# Patient Record
Sex: Male | Born: 1965
Health system: Southern US, Community
[De-identification: ages and names within clinical notes are randomized; demographics above are authoritative.]

## PROBLEM LIST (undated history)

## (undated) DIAGNOSIS — J189 Pneumonia, unspecified organism: Secondary | ICD-10-CM

## (undated) DIAGNOSIS — G20A1 Parkinson's disease without dyskinesia, without mention of fluctuations: Secondary | ICD-10-CM

## (undated) DIAGNOSIS — R251 Tremor, unspecified: Secondary | ICD-10-CM

## (undated) DIAGNOSIS — E785 Hyperlipidemia, unspecified: Secondary | ICD-10-CM

## (undated) DIAGNOSIS — F419 Anxiety disorder, unspecified: Secondary | ICD-10-CM

## (undated) DIAGNOSIS — J40 Bronchitis, not specified as acute or chronic: Secondary | ICD-10-CM

## (undated) HISTORY — PX: HERNIA REPAIR: SHX51

## (undated) HISTORY — PX: APPENDECTOMY: SHX54

## (undated) HISTORY — DX: Tremor, unspecified: R25.1

## (undated) HISTORY — DX: Bronchitis, not specified as acute or chronic: J40

---

## 1998-11-25 ENCOUNTER — Emergency Department (HOSPITAL_COMMUNITY): Admission: EM | Admit: 1998-11-25 | Discharge: 1998-11-25 | Payer: Self-pay | Admitting: *Deleted

## 2002-02-26 ENCOUNTER — Emergency Department (HOSPITAL_COMMUNITY): Admission: EM | Admit: 2002-02-26 | Discharge: 2002-02-26 | Payer: Self-pay | Admitting: *Deleted

## 2002-02-26 ENCOUNTER — Encounter: Payer: Self-pay | Admitting: *Deleted

## 2002-03-03 ENCOUNTER — Inpatient Hospital Stay (HOSPITAL_COMMUNITY): Admission: EM | Admit: 2002-03-03 | Discharge: 2002-03-08 | Payer: Self-pay | Admitting: Psychiatry

## 2002-03-08 ENCOUNTER — Encounter (INDEPENDENT_AMBULATORY_CARE_PROVIDER_SITE_OTHER): Payer: Self-pay

## 2002-03-08 ENCOUNTER — Inpatient Hospital Stay (HOSPITAL_COMMUNITY): Admission: EM | Admit: 2002-03-08 | Discharge: 2002-03-10 | Payer: Self-pay | Admitting: Emergency Medicine

## 2002-03-08 ENCOUNTER — Encounter: Payer: Self-pay | Admitting: Emergency Medicine

## 2002-08-01 ENCOUNTER — Emergency Department (HOSPITAL_COMMUNITY): Admission: EM | Admit: 2002-08-01 | Discharge: 2002-08-01 | Payer: Self-pay | Admitting: Emergency Medicine

## 2005-07-14 ENCOUNTER — Ambulatory Visit (HOSPITAL_COMMUNITY): Admission: RE | Admit: 2005-07-14 | Discharge: 2005-07-14 | Payer: Self-pay | Admitting: Family Medicine

## 2007-01-20 ENCOUNTER — Observation Stay (HOSPITAL_COMMUNITY): Admission: RE | Admit: 2007-01-20 | Discharge: 2007-01-21 | Payer: Self-pay | Admitting: General Surgery

## 2007-02-14 ENCOUNTER — Emergency Department (HOSPITAL_COMMUNITY): Admission: EM | Admit: 2007-02-14 | Discharge: 2007-02-14 | Payer: Self-pay | Admitting: Emergency Medicine

## 2007-04-09 ENCOUNTER — Ambulatory Visit (HOSPITAL_COMMUNITY): Admission: RE | Admit: 2007-04-09 | Discharge: 2007-04-09 | Payer: Self-pay | Admitting: Family Medicine

## 2008-05-09 ENCOUNTER — Ambulatory Visit (HOSPITAL_COMMUNITY): Admission: RE | Admit: 2008-05-09 | Discharge: 2008-05-09 | Payer: Self-pay | Admitting: Orthopaedic Surgery

## 2008-06-24 ENCOUNTER — Emergency Department (HOSPITAL_COMMUNITY): Admission: EM | Admit: 2008-06-24 | Discharge: 2008-06-24 | Payer: Self-pay | Admitting: Emergency Medicine

## 2008-07-06 ENCOUNTER — Ambulatory Visit (HOSPITAL_COMMUNITY): Admission: RE | Admit: 2008-07-06 | Discharge: 2008-07-06 | Payer: Self-pay | Admitting: Orthopaedic Surgery

## 2008-11-12 ENCOUNTER — Emergency Department (HOSPITAL_COMMUNITY): Admission: EM | Admit: 2008-11-12 | Discharge: 2008-11-12 | Payer: Self-pay | Admitting: Emergency Medicine

## 2010-11-19 NOTE — Op Note (Signed)
NAMEHIRAN, [name]                    ACCOUNT NO.:  [id number]   MEDICAL RECORD NO.:  [id number]          PATIENT TYPE:  OBV   LOCATION:  A312                          FACILITY:  [facility]   PHYSICIAN:  Barbaraann Barthel, M.D. DATE OF BIRTH:  February 11, 1966   DATE OF PROCEDURE:  DATE OF DISCHARGE:                               OPERATIVE REPORT   NOTE:  This is a 45 year old male who had an umbilical hernia present  for several months.  He came to my office for elective repair.  This was  a small hernia and we decided to fix it, electively, and we discussed  complications not limited to; but including bleeding, infection and  recurrence.  Informed consent was obtained.   GROSS OPERATIVE FINDINGS:  Those consistent with a small umbilical  hernia defect.  This was closed with 1-0 Vicryl suture.   TECHNIQUE:  The patient was placed in the supine position after the  adequate administration of general anesthesia via LMA anesthesia.  He  was shaved, prepped with Betadine solution, and draped in usual manner.  A periumbilical incision was carried out over the superior aspect of the  umbilicus.  The umbilical hernia was dissected free this was small.  There was no specimen sent.  The defect was easily fixed with one suture  of GU #0 Vicryl suture.  This was done in a figure-of-eight fashion.  We  then used approximately 6 mL of 1/2% Sensorcaine for postoperative  comfort; and after irrigating we closed the skin with a stapling device  and applied a dressing with 4x4s and Neosporin ointment.   Prior to closure all sponge, needle, and instrument counts were found be  correct.  Estimated blood loss was minimal.  The patient received 500 mL  of crystalloids intraoperatively.  No drains were placed.  No specimens  were sent.  There were no complications.      Barbaraann Barthel, M.D.  Electronically Signed     WB/MEDQ  D:  01/20/2007  T:  01/20/2007  Job:  [id number]   cc:   Kirk Ruths, M.D.  Fax: [phone]

## 2010-11-22 NOTE — Discharge Summary (Signed)
NAMEALOK, MINSHALL NO.:  [id number]   MEDICAL RECORD NO.:  [id number]                  PATIENT TYPE:  IPS   LOCATION:                                       FACILITY:  [facility]   PHYSICIAN:  Milford Cage, M.D.                 DATE OF BIRTH:   DATE OF ADMISSION:  03/03/2002  DATE OF DISCHARGE:  03/08/2002                                 DISCHARGE SUMMARY   REASON FOR ADMISSION:  The patient was a 45 year old Caucasian male who was  admitted on a voluntary basis.  He presented to the emergency room with his  wife, who found him holding a gun to his head at home.  He stated that he  couldn't take it anymore.  He reports three years of multiple stressors  with wife out of work, credit card debt, illness of child and job stress.  His sleep has been disrupted and he feels hopeless to manage family  stresses.  He has not been able to concentrate at work.  He denies homicidal  ideation or auditory or visual hallucinations.  He denies manic symptoms or  panic attacks.  He has had no previous psychiatric treatment and is on no  current medications.   For further admission information, see the psychiatric admission assessment.   PHYSICAL EXAMINATION:  Done at the emergency department and reviewed by our  nurse practitioner.   LABORATORY DATA:  On admission, routine chemistry profile was within normal  limits except for a slightly decreased potassium of 3.4.  The hypothyroid  panel with TSH was within normal limits.  Urinalysis was negative.   HOSPITAL COURSE:  Upon admission, patient was placed on Ambien 10 mg p.o.  q.h.s. p.r.n. to repeat x 1 if needed for his insomnia.  He was also placed  on Seroquel 50 mg p.o. q.4h. p.r.n. anxiety.  On March 04, 2002, he was  begun on Zoloft 25 mg q.d.  There was a family session ordered with his wife  to address the stressors at home.  On March 06, 2002, Zoloft was increased  to 50 mg q.d. and Seroquel was increased  to 50 mg to be given with the  Zoloft dose since he felt somewhat activated after the antidepressant dose.  On March 07, 2002, trazodone 50 mg p.o. q.h.s. was added to his regimen.  On March 08, 2002, he began to complain of abdominal pain, nausea,  vomiting and diarrhea.  He was transferred to [facility] ED for evaluation.  At this time, he was admitted to [facility] for treatment of his  GI distress and did not return to our unit.   DISCHARGE MENTAL STATUS EXAM:  The patient had been improving but, at the  time of transfer to [facility], was in obvious discomfort due to his severe  abdominal pain.  An accurate  mental status examination was not able to be  done.   DISCHARGE DIAGNOSES:   AXIS I:  Major depression, single episode, severe.   AXIS II:  None.   AXIS III:  Acute abdominal pain.   AXIS IV:  Severe (marital stress as well as other stressors related in the  history of present illness).   AXIS V:  Global Assessment of Functioning upon admission 30; Global  Assessment of Functioning upon discharge 45; highest Global Assessment of  Functioning in past year 70.   DISCHARGE MEDICATIONS:  1. Zoloft 50 mg q.d.  2. Ambien 10 mg q.h.s.  3. Seroquel 50 mg with Zoloft dose and 50 mg p.o. q.4h. p.r.n. anxiety.   PLAN:  Other discharge planning was not able to be done due to his acute  abdomen and admission to [facility].  He was followed by the  consulting psychiatrist in that setting who would make the final disposition  orders.                                               Milford Cage, M.D.    BS/MEDQ  D:  05/25/2002  T:  05/26/2002  Job:  [id number]

## 2010-11-22 NOTE — Discharge Summary (Signed)
[name], [name]                              ACCOUNT NO.:  [id number]   MEDICAL RECORD NO.:  [id number]                   PATIENT TYPE:  INP   LOCATION:  0361                                 FACILITY:  [facility]   PHYSICIAN:  Anselm Pancoast. Zachery Dakins, M.D.          DATE OF BIRTH:  09-10-1965   DATE OF ADMISSION:  03/08/2002  DATE OF DISCHARGE:  03/10/2002                                 DISCHARGE SUMMARY   DISCHARGE DIAGNOSIS:  Acute appendicitis recently hospitalized at [facility] with depression and a suicide attempt or gesture.   OPERATION:  Laparoscopic appendectomy.   HISTORY:  The patient is a 45 year old Caucasian male who over the past two  weeks has had kind of a complex medical history.  Recently started having  pain in the lower abdomen and was seen at [facility].  Lab studies and a CT  were performed.  The appendix was normal.  He continued having pain and  episodes of nausea and vomiting, and then I think basically had a suicide  gesture and was hospitalized at [facility] health.  He was there for  about four or five days and started having nausea and vomiting approximately  12 hours prior to them transferring him over to the ER at [facility] where  he was seen by Dr. Beverely Pace.  At this time, his white count was 17,700.  He was  definitely tender in the lower abdomen and Dr. Beverely Pace obtained a repeat CT.  This time, it was consistent with acute appendicitis.  I was on call and was  asked to see the patient and on examination he was definitely tender in the  lower abdomen and was bloated, and I recommended we proceed on with a  appendectomy.  Hopefully, it could be performed laparoscopically.  The  attendant that had brought him over from [facility] left, and I told  him I would contact Dr. Kathrynn Running on whether he would need to be transferred  back to [facility] or what when he recovered from this appendectomy.   HOSPITAL COURSE:  At surgery, he did  have an acutely inflamed appendix that  was not ruptured, it was markedly inflamed but it was not such that had been  going on for 10 or 12 days and a laparoscopic appendectomy could be  performed, and we had him on Unasyn perioperatively.  The patient had done  nicely with the exception that he is a little bloated.  He has started  passing flatus today.  His incisions are healing nicely, and Dr. Kathrynn Running did  call and talk with the nurse and said that he needs to be on Zoloft 50 mg a  day which was given yesterday and today.  He is now on the second day, now  about 36 hours after surgery.  His abdomen is soft, incisions are healing  nicely, and the repeat white  count is 9800, and I think he can be  discharged.  The patient's wife said that she has talked with Dr. Kathrynn Running  and they have released him and said that we could just write a prescription  for the Zoloft.  I would like to be a little surer that this is the  instruction since supposedly this was a suicide gesture and what followup  from a psychiatry standpoint have been arranged I am not sure.  The patient  is ready to go home from a general surgical  standpoint, and a call has been placed for Dr. Kathrynn Running to confirm the wife's  instructions and arrange for the Zoloft to be picked up.  I will discharge  him on a few Vicodin if needed for pain, but I have encouraged him to be  active, ambulate, and Milk of Magnesia if he continues to have a little  problem with the constipation.                                               Anselm Pancoast. Zachery Dakins, M.D.    WJW/MEDQ  D:  03/10/2002  T:  03/10/2002  Job:  [id number]   cc:   Jeanice Lim, MD  [address]  [place], [place] [address]  Fax: 1

## 2010-11-22 NOTE — Op Note (Signed)
[name], DESANTIS                             ACCOUNT NO.:  [id number]   MEDICAL RECORD NO.:  [id number]                   PATIENT TYPE:  INP   LOCATION:  0107                                 FACILITY:  [facility]   PHYSICIAN:  Anselm Pancoast. Zachery Dakins, M.D.          DATE OF BIRTH:  1966/06/22   DATE OF PROCEDURE:  03/08/2002  DATE OF DISCHARGE:                                 OPERATIVE REPORT   PREOPERATIVE DIAGNOSIS:  Acute appendicitis.   POSTOPERATIVE DIAGNOSIS:  Acute appendicitis.   OPERATION:  Laparoscopic appendectomy.   ANESTHESIA:  General.   SURGEON:  Anselm Pancoast. Zachery Dakins, M.D.   ASSISTANT:  Nurse.   HISTORY:  The patient is a 45 year old male, who was sent over from  [facility] for nausea and vomiting of about 24 plus hours'  duration.  The patient is a 45 year old male, who approximately 10 days ago,  had an episode of abdominal pain, was seen in the emergency room at [facility].  A CT was performed which showed no evidence of acute appendicitis,  and he was released.  Several days later, he was seen again by a physician  for abdominal pain and then approximately five days ago, had a suicide  gesture and was admitted to the [facility] at that time.  He  was started on medication, and his wife said that they thought he was  possibly going to be released today but then over the weekend, started  having nausea and vomiting that persisted and was transferred over this  morning to the ER where Dr. Beverely Pace examined him, and he was definitely tender  in the lower abdomen, the right more so than left.  White count was 17,000,  and a CT was performed which now shows acute appendicitis.  I was called to  see the patient, and he was definitely acutely tender with kind of a bloated  abdomen and very tender and muscle guarding in the right lower quadrant.  The appendix looked like it was anterior to the cecum, and I recommended we  attempt to try to do  this with the laparoscope, and hopefully this is not  something that is chronically appendiceal abscess type problem.   DESCRIPTION OF PROCEDURE:  He was taken to surgery, given 3 g of Unasyn,  induction of general anesthesia endotracheal tube, oral tube into the  stomach and a Foley catheter inserted sterilely, and the area shaved below  the umbilicus, left lower quadrant, right lower quadrant, and where the  trocar for laparoscopic appendectomy was performed, and a small vertical  incision made below the umbilicus.  The fascia was identified and picked up  between to [facility], entered carefully.  Underlying peritoneum identified and  opened and then a traction suture of 0 Vicryl placed and Hasson cannula  introduced.  You could see the acute inflamed appendix in the right lower  quadrant, not  ruptured but obvious acutely inflamed.  The appendix looked  like someone's who has been having pain for 24-48 hours' duration, and the  upper 5 mm trocar was placed in the subxiphoid area, and then the lateral  left lower quadrant 10-11 trocar placed under direct vision.  With these, I  was able to grasp the appendix and retract it upward, and then the  appendiceal mesentery was separated out using a right angle and then the  vascular GIA used to transect the mesentery.  The cecum was not completely  visualized, but there was a little peritoneal reflection that we tried to  open up with the hook electrocautery but then showed the junction of the  inflamed portion with the cecum, and there was a little knuckle of appendix  that was normal, and I clamped this with a second load of the vascular GIA  and fired it.  The little area just to the lateral aspect did not completely  transect, and I reloaded the vascular GIA and then re-fired this little tip  in case it was not completely crossed with the staple line.  I then placed  the acutely inflamed appendix in the EndoCatch bag and then thoroughly   irrigated the right lower quadrant and aspirated.  I then brought out the  inflamed appendix within the bag at the umbilicus, reinserting the Hasson  cannula and then with the camera in the umbilicus, I thoroughly irrigated,  identified the suture line, and could see all the stapled areas which were  not bleeding.  The mesentery was not bleeding and then again irrigated and  aspirated.  The left lower quadrant trocar was removed first under direct  vision, no bleeding.  Then the little remaining irrigating fluid was  aspirated and the 5 mm trocar withdrawn, and then the carbon dioxide was  released and the umbilical trocar withdrawn.  I then placed a figure-of-  eight in addition to the pursestring, and these two both individually tied.  The subcutaneous wounds were closed with 4-0 Vicryl and Benzoin and Steri-  Strips on the skin.  I did not use Marcaine since this was an acutely  inflamed appendix.  The patient tolerated the procedure nicely and was  extubated and sent to the recovery room is a stable postop condition.  I  will talk with his psychiatrist on whether he needs to be on suicide  precautions and whether he will need to be transferred to the [facility] tomorrow or whether he can be released after his recuperation  from the acutely inflamed appendix.  I do not think from the findings that  were found at the time of surgery that this is something that has been going  on for 10 plus days, as the appendix certainly looked as if it was a typical  early appendicitis.                                                 Anselm Pancoast. Zachery Dakins, M.D.    WJW/MEDQ  D:  03/08/2002  T:  03/08/2002  Job:  [id number]   cc:   Jeanice Lim, MD  [address]  [place], [place] [address]  Fax: 1

## 2011-01-07 ENCOUNTER — Emergency Department (HOSPITAL_COMMUNITY)
Admission: EM | Admit: 2011-01-07 | Discharge: 2011-01-07 | Disposition: A | Discharge status: Home / Self Care | Payer: BC Managed Care – PPO | Attending: Emergency Medicine | Admitting: Emergency Medicine

## 2011-01-07 DIAGNOSIS — E860 Dehydration: Secondary | ICD-10-CM | POA: Insufficient documentation

## 2011-01-07 DIAGNOSIS — R112 Nausea with vomiting, unspecified: Secondary | ICD-10-CM | POA: Insufficient documentation

## 2011-01-07 DIAGNOSIS — Z79899 Other long term (current) drug therapy: Secondary | ICD-10-CM | POA: Insufficient documentation

## 2011-01-07 LAB — URINALYSIS, ROUTINE W REFLEX MICROSCOPIC
Bilirubin Urine: NEGATIVE
Ketones, ur: 15 mg/dL — AB
Specific Gravity, Urine: 1.015 (ref 1.005–1.030)
Urobilinogen, UA: 0.2 mg/dL (ref 0.0–1.0)

## 2011-01-07 LAB — BASIC METABOLIC PANEL
BUN: 18 mg/dL (ref 6–23)
Chloride: 101 mEq/L (ref 96–112)
Glucose, Bld: 118 mg/dL — ABNORMAL HIGH (ref 70–99)
Potassium: 3.8 mEq/L (ref 3.5–5.1)

## 2011-04-21 LAB — CBC
HCT: 40.3
MCV: 88
Platelets: 338
RBC: 4.57
WBC: 11.9 — ABNORMAL HIGH

## 2011-04-21 LAB — CULTURE, BLOOD (ROUTINE X 2)
Culture: NO GROWTH
Report Status: 8162008

## 2011-04-21 LAB — DIFFERENTIAL
Basophils Absolute: 0.1
Basophils Relative: 1
Monocytes Relative: 11
Neutrophils Relative %: 63

## 2011-04-21 LAB — WOUND CULTURE

## 2011-09-01 ENCOUNTER — Other Ambulatory Visit (HOSPITAL_COMMUNITY): Payer: Self-pay | Admitting: Family Medicine

## 2011-09-01 ENCOUNTER — Ambulatory Visit (HOSPITAL_COMMUNITY)
Admission: RE | Admit: 2011-09-01 | Discharge: 2011-09-01 | Disposition: A | Discharge status: Home / Self Care | Payer: BC Managed Care – PPO | Source: Ambulatory Visit | Attending: Family Medicine | Admitting: Family Medicine

## 2011-09-01 DIAGNOSIS — R05 Cough: Secondary | ICD-10-CM

## 2011-09-01 DIAGNOSIS — J209 Acute bronchitis, unspecified: Secondary | ICD-10-CM | POA: Insufficient documentation

## 2012-04-23 ENCOUNTER — Other Ambulatory Visit (HOSPITAL_COMMUNITY): Payer: Self-pay | Admitting: Orthopaedic Surgery

## 2012-04-23 DIAGNOSIS — R52 Pain, unspecified: Secondary | ICD-10-CM

## 2012-04-26 ENCOUNTER — Ambulatory Visit (HOSPITAL_COMMUNITY)
Admission: RE | Admit: 2012-04-26 | Discharge: 2012-04-26 | Disposition: A | Discharge status: Home / Self Care | Payer: BC Managed Care – PPO | Source: Ambulatory Visit | Attending: Orthopaedic Surgery | Admitting: Orthopaedic Surgery

## 2012-04-26 DIAGNOSIS — R52 Pain, unspecified: Secondary | ICD-10-CM

## 2012-04-26 DIAGNOSIS — M25519 Pain in unspecified shoulder: Secondary | ICD-10-CM | POA: Insufficient documentation

## 2012-04-26 DIAGNOSIS — R937 Abnormal findings on diagnostic imaging of other parts of musculoskeletal system: Secondary | ICD-10-CM | POA: Insufficient documentation

## 2012-05-14 ENCOUNTER — Other Ambulatory Visit (HOSPITAL_COMMUNITY): Payer: Self-pay | Admitting: Orthopaedic Surgery

## 2012-05-14 DIAGNOSIS — M542 Cervicalgia: Secondary | ICD-10-CM

## 2012-05-17 ENCOUNTER — Ambulatory Visit (HOSPITAL_COMMUNITY)
Admission: RE | Admit: 2012-05-17 | Discharge: 2012-05-17 | Disposition: A | Discharge status: Home / Self Care | Payer: BC Managed Care – PPO | Source: Ambulatory Visit | Attending: Orthopaedic Surgery | Admitting: Orthopaedic Surgery

## 2012-05-17 DIAGNOSIS — M542 Cervicalgia: Secondary | ICD-10-CM

## 2012-05-17 DIAGNOSIS — M5020 Other cervical disc displacement, unspecified cervical region: Secondary | ICD-10-CM | POA: Insufficient documentation

## 2012-05-18 ENCOUNTER — Encounter (HOSPITAL_COMMUNITY): Payer: Self-pay | Admitting: Pharmacy Technician

## 2012-05-18 ENCOUNTER — Other Ambulatory Visit: Payer: Self-pay | Admitting: Neurosurgery

## 2012-05-20 ENCOUNTER — Other Ambulatory Visit (HOSPITAL_COMMUNITY): Payer: BC Managed Care – PPO

## 2012-05-20 ENCOUNTER — Encounter (HOSPITAL_COMMUNITY): Payer: Self-pay

## 2012-05-20 ENCOUNTER — Encounter (HOSPITAL_COMMUNITY)
Admission: RE | Admit: 2012-05-20 | Discharge: 2012-05-20 | Disposition: A | Discharge status: Home / Self Care | Payer: BC Managed Care – PPO | Source: Ambulatory Visit | Attending: Neurosurgery | Admitting: Neurosurgery

## 2012-05-20 HISTORY — DX: Hyperlipidemia, unspecified: E78.5

## 2012-05-20 HISTORY — DX: Pneumonia, unspecified organism: J18.9

## 2012-05-20 HISTORY — DX: Anxiety disorder, unspecified: F41.9

## 2012-05-20 LAB — BASIC METABOLIC PANEL
CO2: 30 mEq/L (ref 19–32)
Calcium: 9.6 mg/dL (ref 8.4–10.5)
Creatinine, Ser: 0.88 mg/dL (ref 0.50–1.35)

## 2012-05-20 LAB — CBC
MCH: 30.1 pg (ref 26.0–34.0)
MCV: 86.3 fL (ref 78.0–100.0)
Platelets: 273 10*3/uL (ref 150–400)
RBC: 4.81 MIL/uL (ref 4.22–5.81)
RDW: 12.3 % (ref 11.5–15.5)

## 2012-05-20 LAB — SURGICAL PCR SCREEN
MRSA, PCR: NEGATIVE
Staphylococcus aureus: NEGATIVE

## 2012-05-20 MED ORDER — CEFAZOLIN SODIUM-DEXTROSE 2-3 GM-% IV SOLR
2.0000 g | INTRAVENOUS | Status: AC
  Administered 2012-05-21: 2 g via INTRAVENOUS
  Filled 2012-05-20: qty 50

## 2012-05-20 NOTE — Pre-Procedure Instructions (Signed)
20 DAVIYON LEAMING  05/20/2012   Your procedure is scheduled on:  Friday May 21, 2012  Report to [facility] at 5:30 AM.  Call this number if you have problems the morning of surgery: [phone]   Remember:   Do not eat food or drink :After Midnight.      Take these medicines the morning of surgery with A SIP OF WATER: xanax, hydrocodone   Do not wear jewelry, make-up or nail polish.  Do not wear lotions, powders, or perfumes.  Do not shave 48 hours prior to surgery. Men may shave face and neck.  Do not bring valuables to the hospital.  Contacts, dentures or bridgework may not be worn into surgery.  Leave suitcase in the car. After surgery it may be brought to your room.  For patients admitted to the hospital, checkout time is 11:00 AM the day of discharge.   Patients discharged the day of surgery will not be allowed to drive home.  Name and phone number of your driver: family / friend  Special Instructions: Shower using CHG 2 nights before surgery and the night before surgery.  If you shower the day of surgery use CHG.  Use special wash - you have one bottle of CHG for all showers.  You should use approximately 1/3 of the bottle for each shower.   Please read over the following fact sheets that you were given: Pain Booklet, Coughing and Deep Breathing, MRSA Information and Surgical Site Infection Prevention

## 2012-05-21 ENCOUNTER — Ambulatory Visit (HOSPITAL_COMMUNITY): Payer: BC Managed Care – PPO

## 2012-05-21 ENCOUNTER — Observation Stay (HOSPITAL_COMMUNITY)
Admission: RE | Admit: 2012-05-21 | Discharge: 2012-05-22 | Disposition: A | Discharge status: Home / Self Care | Payer: BC Managed Care – PPO | Source: Ambulatory Visit | Attending: Neurosurgery | Admitting: Neurosurgery

## 2012-05-21 ENCOUNTER — Encounter (HOSPITAL_COMMUNITY): Payer: Self-pay

## 2012-05-21 ENCOUNTER — Ambulatory Visit (HOSPITAL_COMMUNITY): Payer: BC Managed Care – PPO | Admitting: Anesthesiology

## 2012-05-21 ENCOUNTER — Encounter (HOSPITAL_COMMUNITY)
Admission: RE | Disposition: A | Discharge status: Home / Self Care | Payer: Self-pay | Source: Ambulatory Visit | Attending: Neurosurgery

## 2012-05-21 ENCOUNTER — Encounter (HOSPITAL_COMMUNITY): Payer: Self-pay | Admitting: Anesthesiology

## 2012-05-21 DIAGNOSIS — M5030 Other cervical disc degeneration, unspecified cervical region: Secondary | ICD-10-CM | POA: Insufficient documentation

## 2012-05-21 DIAGNOSIS — M47812 Spondylosis without myelopathy or radiculopathy, cervical region: Secondary | ICD-10-CM | POA: Insufficient documentation

## 2012-05-21 DIAGNOSIS — E785 Hyperlipidemia, unspecified: Secondary | ICD-10-CM | POA: Insufficient documentation

## 2012-05-21 DIAGNOSIS — Z79899 Other long term (current) drug therapy: Secondary | ICD-10-CM | POA: Insufficient documentation

## 2012-05-21 DIAGNOSIS — Z01812 Encounter for preprocedural laboratory examination: Secondary | ICD-10-CM | POA: Insufficient documentation

## 2012-05-21 DIAGNOSIS — M5020 Other cervical disc displacement, unspecified cervical region: Principal | ICD-10-CM | POA: Insufficient documentation

## 2012-05-21 HISTORY — PX: ANTERIOR CERVICAL DECOMP/DISCECTOMY FUSION: SHX1161

## 2012-05-21 SURGERY — ANTERIOR CERVICAL DECOMPRESSION/DISCECTOMY FUSION 2 LEVELS
Anesthesia: General | Site: Neck | Laterality: Bilateral | Wound class: Clean

## 2012-05-21 MED ORDER — KETOROLAC TROMETHAMINE 30 MG/ML IJ SOLN
INTRAMUSCULAR | Status: AC
  Administered 2012-05-21: 30 mg
  Filled 2012-05-21: qty 1

## 2012-05-21 MED ORDER — ACETAMINOPHEN 10 MG/ML IV SOLN
1000.0000 mg | Freq: Four times a day (QID) | INTRAVENOUS | Status: DC
  Administered 2012-05-21 – 2012-05-22 (×3): 1000 mg via INTRAVENOUS
  Filled 2012-05-21 (×4): qty 100

## 2012-05-21 MED ORDER — DIAZEPAM 5 MG/ML IJ SOLN
2.5000 mg | Freq: Once | INTRAMUSCULAR | Status: AC
  Administered 2012-05-21: 2.5 mg via INTRAVENOUS

## 2012-05-21 MED ORDER — NAPHAZOLINE-GLYCERIN 0.012-0.2 % OP SOLN
1.0000 [drp] | OPHTHALMIC | Status: DC | PRN
  Filled 2012-05-21: qty 15

## 2012-05-21 MED ORDER — HEMOSTATIC AGENTS (NO CHARGE) OPTIME
TOPICAL | Status: DC | PRN
  Administered 2012-05-21: 1 via TOPICAL

## 2012-05-21 MED ORDER — LIDOCAINE HCL (CARDIAC) 20 MG/ML IV SOLN
INTRAVENOUS | Status: DC | PRN
  Administered 2012-05-21: 75 mg via INTRAVENOUS

## 2012-05-21 MED ORDER — BUPIVACAINE HCL (PF) 0.5 % IJ SOLN
INTRAMUSCULAR | Status: DC | PRN
  Administered 2012-05-21: 5 mL

## 2012-05-21 MED ORDER — OXYCODONE HCL 5 MG PO TABS
5.0000 mg | ORAL_TABLET | Freq: Once | ORAL | Status: DC | PRN
  Administered 2012-05-21: 5 mg via ORAL

## 2012-05-21 MED ORDER — PHENOL 1.4 % MT LIQD: 1.0000 | OROMUCOSAL | Status: DC | PRN

## 2012-05-21 MED ORDER — SODIUM CHLORIDE 0.9 % IJ SOLN
3.0000 mL | Freq: Two times a day (BID) | INTRAMUSCULAR | Status: DC
  Administered 2012-05-21: 3 mL via INTRAVENOUS

## 2012-05-21 MED ORDER — MORPHINE SULFATE 4 MG/ML IJ SOLN
4.0000 mg | INTRAMUSCULAR | Status: DC | PRN
  Administered 2012-05-21 (×2): 4 mg via INTRAMUSCULAR
  Filled 2012-05-21 (×2): qty 1

## 2012-05-21 MED ORDER — 0.9 % SODIUM CHLORIDE (POUR BTL) OPTIME
TOPICAL | Status: DC | PRN
  Administered 2012-05-21: 1000 mL

## 2012-05-21 MED ORDER — THROMBIN 5000 UNITS EX KIT
PACK | CUTANEOUS | Status: DC | PRN
  Administered 2012-05-21 (×2): 5000 [IU] via TOPICAL

## 2012-05-21 MED ORDER — GLYCOPYRROLATE 0.2 MG/ML IJ SOLN
INTRAMUSCULAR | Status: DC | PRN
  Administered 2012-05-21: .8 mg via INTRAVENOUS

## 2012-05-21 MED ORDER — THROMBIN 5000 UNITS EX SOLR
OROMUCOSAL | Status: DC | PRN
  Administered 2012-05-21: 10:00:00 via TOPICAL

## 2012-05-21 MED ORDER — DIAZEPAM 5 MG/ML IJ SOLN
INTRAMUSCULAR | Status: AC
  Filled 2012-05-21: qty 2

## 2012-05-21 MED ORDER — KETOROLAC TROMETHAMINE 30 MG/ML IJ SOLN
30.0000 mg | Freq: Once | INTRAMUSCULAR | Status: DC
  Administered 2012-05-21: 30 mg via INTRAVENOUS

## 2012-05-21 MED ORDER — OXYCODONE HCL 5 MG PO TABS
ORAL_TABLET | ORAL | Status: AC
  Filled 2012-05-21: qty 1

## 2012-05-21 MED ORDER — SODIUM CHLORIDE 0.9 % IJ SOLN: 3.0000 mL | INTRAMUSCULAR | Status: DC | PRN

## 2012-05-21 MED ORDER — FENTANYL CITRATE 0.05 MG/ML IJ SOLN
INTRAMUSCULAR | Status: DC | PRN
  Administered 2012-05-21 (×3): 50 ug via INTRAVENOUS
  Administered 2012-05-21: 100 ug via INTRAVENOUS
  Administered 2012-05-21 (×2): 50 ug via INTRAVENOUS
  Administered 2012-05-21: 100 ug via INTRAVENOUS

## 2012-05-21 MED ORDER — MAGNESIUM HYDROXIDE 400 MG/5ML PO SUSP: 30.0000 mL | Freq: Every day | ORAL | Status: DC | PRN

## 2012-05-21 MED ORDER — SODIUM CHLORIDE 0.9 % IV SOLN
INTRAVENOUS | Status: AC
  Filled 2012-05-21: qty 500

## 2012-05-21 MED ORDER — PROPOFOL 10 MG/ML IV BOLUS
INTRAVENOUS | Status: DC | PRN
  Administered 2012-05-21: 200 mg via INTRAVENOUS

## 2012-05-21 MED ORDER — HYDROXYZINE HCL 50 MG/ML IM SOLN
50.0000 mg | INTRAMUSCULAR | Status: DC | PRN
  Filled 2012-05-21 (×2): qty 1

## 2012-05-21 MED ORDER — OXYCODONE HCL 5 MG PO TABS
5.0000 mg | ORAL_TABLET | ORAL | Status: DC | PRN
  Administered 2012-05-21 – 2012-05-22 (×3): 10 mg via ORAL
  Filled 2012-05-21 (×3): qty 2

## 2012-05-21 MED ORDER — NEOSTIGMINE METHYLSULFATE 1 MG/ML IJ SOLN
INTRAMUSCULAR | Status: DC | PRN
  Administered 2012-05-21: 4 mg via INTRAVENOUS

## 2012-05-21 MED ORDER — BISACODYL 10 MG RE SUPP: 10.0000 mg | Freq: Every day | RECTAL | Status: DC | PRN

## 2012-05-21 MED ORDER — ACETAMINOPHEN 650 MG RE SUPP: 650.0000 mg | RECTAL | Status: DC | PRN

## 2012-05-21 MED ORDER — INFLUENZA VIRUS VACC SPLIT PF IM SUSP
0.5000 mL | INTRAMUSCULAR | Status: DC
  Filled 2012-05-21: qty 0.5

## 2012-05-21 MED ORDER — ACETAMINOPHEN 325 MG PO TABS: 650.0000 mg | ORAL_TABLET | ORAL | Status: DC | PRN

## 2012-05-21 MED ORDER — POLYVINYL ALCOHOL 1.4 % OP SOLN
1.0000 [drp] | OPHTHALMIC | Status: DC | PRN
  Filled 2012-05-21: qty 15

## 2012-05-21 MED ORDER — OXYCODONE HCL 5 MG/5ML PO SOLN: 5.0000 mg | Freq: Once | ORAL | Status: DC | PRN

## 2012-05-21 MED ORDER — ERYTHROMYCIN 5 MG/GM OP OINT
TOPICAL_OINTMENT | Freq: Three times a day (TID) | OPHTHALMIC | Status: DC
  Administered 2012-05-21 – 2012-05-22 (×2): via OPHTHALMIC
  Filled 2012-05-21: qty 3.5

## 2012-05-21 MED ORDER — ROCURONIUM BROMIDE 100 MG/10ML IV SOLN
INTRAVENOUS | Status: DC | PRN
  Administered 2012-05-21 (×2): 20 mg via INTRAVENOUS
  Administered 2012-05-21: 50 mg via INTRAVENOUS

## 2012-05-21 MED ORDER — ONDANSETRON HCL 4 MG/2ML IJ SOLN: 4.0000 mg | Freq: Four times a day (QID) | INTRAMUSCULAR | Status: DC | PRN

## 2012-05-21 MED ORDER — SODIUM CHLORIDE 0.9 % IV SOLN: 250.0000 mL | INTRAVENOUS | Status: DC

## 2012-05-21 MED ORDER — MIDAZOLAM HCL 5 MG/5ML IJ SOLN
INTRAMUSCULAR | Status: DC | PRN
  Administered 2012-05-21: 2 mg via INTRAVENOUS

## 2012-05-21 MED ORDER — DEXAMETHASONE SODIUM PHOSPHATE 10 MG/ML IJ SOLN
INTRAMUSCULAR | Status: DC | PRN
  Administered 2012-05-21: 8 mg via INTRAVENOUS

## 2012-05-21 MED ORDER — ALPRAZOLAM 0.5 MG PO TABS: 0.5000 mg | ORAL_TABLET | Freq: Three times a day (TID) | ORAL | Status: DC | PRN

## 2012-05-21 MED ORDER — HYDROXYZINE HCL 25 MG PO TABS
50.0000 mg | ORAL_TABLET | ORAL | Status: DC | PRN
  Administered 2012-05-21: 50 mg via ORAL

## 2012-05-21 MED ORDER — KETOROLAC TROMETHAMINE 30 MG/ML IJ SOLN
30.0000 mg | Freq: Four times a day (QID) | INTRAMUSCULAR | Status: DC
  Administered 2012-05-21 – 2012-05-22 (×3): 30 mg via INTRAVENOUS
  Filled 2012-05-21 (×7): qty 1

## 2012-05-21 MED ORDER — BACITRACIN 50000 UNITS IM SOLR
INTRAMUSCULAR | Status: AC
  Filled 2012-05-21: qty 1

## 2012-05-21 MED ORDER — ACETAMINOPHEN 10 MG/ML IV SOLN
INTRAVENOUS | Status: AC
  Administered 2012-05-21: 1000 mg via INTRAVENOUS
  Filled 2012-05-21: qty 100

## 2012-05-21 MED ORDER — HYDROMORPHONE HCL PF 1 MG/ML IJ SOLN
0.2500 mg | INTRAMUSCULAR | Status: DC | PRN
  Administered 2012-05-21 (×2): 0.5 mg via INTRAVENOUS

## 2012-05-21 MED ORDER — ZOLPIDEM TARTRATE 5 MG PO TABS: 10.0000 mg | ORAL_TABLET | Freq: Every evening | ORAL | Status: DC | PRN

## 2012-05-21 MED ORDER — HYDROMORPHONE HCL PF 1 MG/ML IJ SOLN
INTRAMUSCULAR | Status: AC
  Filled 2012-05-21: qty 1

## 2012-05-21 MED ORDER — LIDOCAINE-EPINEPHRINE 1 %-1:100000 IJ SOLN
INTRAMUSCULAR | Status: DC | PRN
  Administered 2012-05-21: 5 mL

## 2012-05-21 MED ORDER — KCL IN DEXTROSE-NACL 20-5-0.45 MEQ/L-%-% IV SOLN
INTRAVENOUS | Status: DC
  Filled 2012-05-21 (×4): qty 1000

## 2012-05-21 MED ORDER — SODIUM CHLORIDE 0.9 % IR SOLN
Status: DC | PRN
  Administered 2012-05-21: 08:00:00

## 2012-05-21 MED ORDER — ALUM & MAG HYDROXIDE-SIMETH 200-200-20 MG/5ML PO SUSP: 30.0000 mL | Freq: Four times a day (QID) | ORAL | Status: DC | PRN

## 2012-05-21 MED ORDER — MENTHOL 3 MG MT LOZG: 1.0000 | LOZENGE | OROMUCOSAL | Status: DC | PRN

## 2012-05-21 MED ORDER — LACTATED RINGERS IV SOLN
INTRAVENOUS | Status: DC | PRN
  Administered 2012-05-21 (×2): via INTRAVENOUS

## 2012-05-21 MED ORDER — CYCLOBENZAPRINE HCL 10 MG PO TABS: 10.0000 mg | ORAL_TABLET | Freq: Three times a day (TID) | ORAL | Status: DC | PRN

## 2012-05-21 SURGICAL SUPPLY — 54 items
ADH SKN CLS APL DERMABOND .7 (GAUZE/BANDAGES/DRESSINGS) ×1
ALLOGRAFT 7X14X11 (Bone Implant) ×4 IMPLANT
BAG DECANTER FOR FLEXI CONT (MISCELLANEOUS) ×2 IMPLANT
BIT DRILL NEURO 2X3.1 SFT TUCH (MISCELLANEOUS) ×1 IMPLANT
BLADE ULTRA TIP 2M (BLADE) ×2 IMPLANT
BRUSH SCRUB EZ PLAIN DRY (MISCELLANEOUS) ×2 IMPLANT
CANISTER SUCTION 2500CC (MISCELLANEOUS) ×2 IMPLANT
CLOTH BEACON ORANGE TIMEOUT ST (SAFETY) ×2 IMPLANT
CONT SPEC 4OZ CLIKSEAL STRL BL (MISCELLANEOUS) ×2 IMPLANT
COVER MAYO STAND STRL (DRAPES) ×2 IMPLANT
DECANTER SPIKE VIAL GLASS SM (MISCELLANEOUS) ×2 IMPLANT
DERMABOND ADVANCED (GAUZE/BANDAGES/DRESSINGS) ×1
DERMABOND ADVANCED .7 DNX12 (GAUZE/BANDAGES/DRESSINGS) ×1 IMPLANT
DRAPE LAPAROTOMY 100X72 PEDS (DRAPES) ×2 IMPLANT
DRAPE MICROSCOPE LEICA (MISCELLANEOUS) ×2 IMPLANT
DRAPE POUCH INSTRU U-SHP 10X18 (DRAPES) ×2 IMPLANT
DRAPE PROXIMA HALF (DRAPES) IMPLANT
DRILL NEURO 2X3.1 SOFT TOUCH (MISCELLANEOUS) ×2
ELECT COATED BLADE 2.86 ST (ELECTRODE) ×2 IMPLANT
ELECT REM PT RETURN 9FT ADLT (ELECTROSURGICAL) ×2
ELECTRODE REM PT RTRN 9FT ADLT (ELECTROSURGICAL) ×1 IMPLANT
GLOVE BIOGEL PI IND STRL 8 (GLOVE) ×1 IMPLANT
GLOVE BIOGEL PI IND STRL 8.5 (GLOVE) ×2 IMPLANT
GLOVE BIOGEL PI INDICATOR 8 (GLOVE) ×1
GLOVE BIOGEL PI INDICATOR 8.5 (GLOVE) ×2
GLOVE ECLIPSE 6.5 STRL STRAW (GLOVE) ×2 IMPLANT
GLOVE ECLIPSE 7.5 STRL STRAW (GLOVE) ×2 IMPLANT
GLOVE EXAM NITRILE LRG STRL (GLOVE) IMPLANT
GLOVE EXAM NITRILE MD LF STRL (GLOVE) ×2 IMPLANT
GLOVE EXAM NITRILE XL STR (GLOVE) IMPLANT
GLOVE EXAM NITRILE XS STR PU (GLOVE) IMPLANT
GLOVE SURG SS PI 8.0 STRL IVOR (GLOVE) ×8 IMPLANT
GOWN BRE IMP SLV AUR LG STRL (GOWN DISPOSABLE) ×2 IMPLANT
GOWN BRE IMP SLV AUR XL STRL (GOWN DISPOSABLE) ×2 IMPLANT
GOWN STRL REIN 2XL LVL4 (GOWN DISPOSABLE) ×4 IMPLANT
HEAD HALTER (SOFTGOODS) ×2 IMPLANT
HEMOSTAT POWDER SURGIFOAM 1G (HEMOSTASIS) ×2 IMPLANT
KIT BASIN OR (CUSTOM PROCEDURE TRAY) ×2 IMPLANT
KIT ROOM TURNOVER OR (KITS) ×2 IMPLANT
NEEDLE HYPO 25X1 1.5 SAFETY (NEEDLE) ×2 IMPLANT
NEEDLE SPNL 22GX3.5 QUINCKE BK (NEEDLE) ×4 IMPLANT
NS IRRIG 1000ML POUR BTL (IV SOLUTION) ×2 IMPLANT
PACK LAMINECTOMY NEURO (CUSTOM PROCEDURE TRAY) ×2 IMPLANT
PAD ARMBOARD 7.5X6 YLW CONV (MISCELLANEOUS) ×6 IMPLANT
RUBBERBAND STERILE (MISCELLANEOUS) ×4 IMPLANT
SPONGE INTESTINAL PEANUT (DISPOSABLE) ×2 IMPLANT
SPONGE SURGIFOAM ABS GEL SZ50 (HEMOSTASIS) ×2 IMPLANT
STAPLER SKIN PROX WIDE 3.9 (STAPLE) IMPLANT
SUT VIC AB 2-0 CP2 18 (SUTURE) ×2 IMPLANT
SUT VIC AB 3-0 SH 8-18 (SUTURE) ×2 IMPLANT
SYR 20ML ECCENTRIC (SYRINGE) ×2 IMPLANT
TOWEL OR 17X24 6PK STRL BLUE (TOWEL DISPOSABLE) ×2 IMPLANT
TOWEL OR 17X26 10 PK STRL BLUE (TOWEL DISPOSABLE) ×2 IMPLANT
WATER STERILE IRR 1000ML POUR (IV SOLUTION) ×2 IMPLANT

## 2012-05-21 NOTE — Anesthesia Postprocedure Evaluation (Signed)
Anesthesia Post Note  Patient: [name]  Procedure(s) Performed: Procedure(s) (LRB): ANTERIOR CERVICAL DECOMPRESSION/DISCECTOMY FUSION 2 LEVELS (Bilateral)  Anesthesia type: General  Patient location: PACU  Post pain: Pain level controlled and Adequate analgesia  Post assessment: Post-op Vital signs reviewed, Patient's Cardiovascular Status Stable, Respiratory Function Stable, Patent Airway and Pain level controlled  Last Vitals:  Filed Vitals:   05/21/12 1035  BP:   Pulse:   Temp: 36.3 C  Resp:     Post vital signs: Reviewed and stable  Level of consciousness: awake, alert  and oriented  Complications: No apparent anesthesia complications

## 2012-05-21 NOTE — Progress Notes (Signed)
Filed Vitals:   05/21/12 1124 05/21/12 1139 05/21/12 1215 05/21/12 1634  BP: 126/77 135/81 138/87 104/75  Pulse: 93 86 98 111  Temp:   97.8 F (36.6 C) 97.5 F (36.4 C)  TempSrc:      Resp: 15 13 16 16   Height:      Weight:      SpO2: 100% 99% 93% 94%    CBC  Basename 05/20/12 1545  WBC 8.4  HGB 14.5  HCT 41.5  PLT 273   BMET  Basename 05/20/12 1545  NA 141  K 4.1  CL 102  CO2 30  GLUCOSE 101*  BUN 8  CREATININE 0.88  CALCIUM 9.6    Patient complaining of right thigh discomfort, suspect minor corneal abrasion, we'll have erythromycin ophthalmologic ointment applied to 8 hours and have the lid taped shut. Has been up and living in the halls. Voiding. Wound clean and dry. Feels upper extremities feel better. Doing well following surgery.  Plan: Encouraged and it actively in the halls.  Hewitt Shorts, MD 05/21/2012, 5:33 PM

## 2012-05-21 NOTE — Anesthesia Preprocedure Evaluation (Signed)
Anesthesia Evaluation  Patient identified by MRN, date of birth, ID band Patient awake    Reviewed: Allergy & Precautions, H&P , NPO status , Patient's Chart, lab work & pertinent test results  Airway Mallampati: II  Neck ROM: full    Dental   Pulmonary          Cardiovascular     Neuro/Psych Anxiety    GI/Hepatic   Endo/Other    Renal/GU      Musculoskeletal   Abdominal   Peds  Hematology   Anesthesia Other Findings   Reproductive/Obstetrics                           Anesthesia Physical Anesthesia Plan  ASA: II  Anesthesia Plan: General   Post-op Pain Management:    Induction: Intravenous  Airway Management Planned: Oral ETT  Additional Equipment:   Intra-op Plan:   Post-operative Plan: Extubation in OR  Informed Consent: I have reviewed the patients History and Physical, chart, labs and discussed the procedure including the risks, benefits and alternatives for the proposed anesthesia with the patient or authorized representative who has indicated his/her understanding and acceptance.     Plan Discussed with: CRNA and Surgeon  Anesthesia Plan Comments:         Anesthesia Quick Evaluation

## 2012-05-21 NOTE — Preoperative (Signed)
Beta Blockers   Reason not to administer Beta Blockers:Not Applicable 

## 2012-05-21 NOTE — Anesthesia Procedure Notes (Signed)
Procedure Name: Intubation Date/Time: 05/21/2012 7:58 AM Performed by: Gwenyth Allegra Pre-anesthesia Checklist: Patient identified, Timeout performed, Emergency Drugs available, Suction available and Patient being monitored Patient Re-evaluated:Patient Re-evaluated prior to inductionOxygen Delivery Method: Circle system utilized Preoxygenation: Pre-oxygenation with 100% oxygen Intubation Type: IV induction Ventilation: Mask ventilation without difficulty and Oral airway inserted - appropriate to patient size Laryngoscope Size: Mac and 4 Grade View: Grade I Tube type: Oral Number of attempts: 1 Airway Equipment and Method: Stylet Placement Confirmation: ETT inserted through vocal cords under direct vision,  breath sounds checked- equal and bilateral and positive ETCO2 Secured at: 22 cm Tube secured with: Tape Dental Injury: Teeth and Oropharynx as per pre-operative assessment

## 2012-05-21 NOTE — Transfer of Care (Signed)
Immediate Anesthesia Transfer of Care Note  Patient: [name]  Procedure(s) Performed: Procedure(s) (LRB) with comments: ANTERIOR CERVICAL DECOMPRESSION/DISCECTOMY FUSION 2 LEVELS (Bilateral) - Cervical five-six, six-seven Anterior cervical decompression/diskectomy/fusion  Patient Location: PACU  Anesthesia Type:General  Level of Consciousness: awake and alert   Airway & Oxygen Therapy: Patient Spontanous Breathing and Patient connected to nasal cannula oxygen  Post-op Assessment: Report given to PACU RN and Post -op Vital signs reviewed and stable  Post vital signs: Reviewed and stable  Complications: No apparent anesthesia complications

## 2012-05-21 NOTE — H&P (Signed)
Subjective: the Patient is a 46 y.o. male who is admitted for treatment of multilevel cervical disc herniations, with underlying degenerative disease and spondylosis. Symptomatically he is been having difficulties with increasing left cervical radiculopathy with pain numbness and paresthesias from the neck down to the left upper extremity. His spine disc herniation C5-6 worsened C6-7, C5-6 broad-based, C6-7 is more focal to the left side. He is admitted now 2 level C5-6 and C6-7 anterior cervical decompression arthrodesis with allograft and cervical plating.   Past Medical History  Diagnosis Date  . Hyperlipidemia     controlled with diet  . Pneumonia     hx of  . Anxiety     "mostly to unwind and go to sleep, works third shift"    Past Surgical History  Procedure Date  . Appendectomy   . Hernia repair     umbilical    Prescriptions prior to admission  Medication Sig Dispense Refill  . ALPRAZolam (XANAX) 0.5 MG tablet Take 1 tablet by mouth Three times daily as needed. For anxiety      . HYDROcodone-acetaminophen (NORCO) 7.5-325 MG per tablet Take 1 tablet by mouth Every 8 hours as needed. For pain       No Known Allergies  History  Substance Use Topics  . Smoking status: Never Smoker   . Smokeless tobacco: Not on file  . Alcohol Use: Yes     Comment: occasional    History reviewed. No pertinent family history.   Review of Systems A comprehensive review of systems was negative.  Objective: Vital signs in last 24 hours: Temp:  [98.1 F (36.7 C)] 98.1 F (36.7 C) (11/15 0615) Pulse Rate:  [71] 71  (11/15 0615) Resp:  [20] 20  (11/15 0615) BP: (129)/(84) 129/84 mmHg (11/15 0615) SpO2:  [97 %] 97 % (11/15 0615) Weight:  [87.998 kg (194 lb)] 87.998 kg (194 lb) (11/15 4098)  EXAM: Patient is a well-developed well-nourished white male in no acute distress. Lungs are clear to auscultation , the patient has symmetrical respiratory excursion. Heart has a regular rate and rhythm  normal S1 and S2 no murmur.   Abdomen is soft nontender nondistended bowel sounds are present. Extremity examination shows no clubbing cyanosis or edema. Musculoskeletal examination shows no tenderness to palpation of the cervical spinous processes or paracervical musculature. He is slightly decreased range of motion flexion extension and lateral flexion to either side. Motor examination shows 5 over 5 strength in the upper extremities including the deltoid biceps triceps and intrinsics and grip. Sensation is intact to pinprick throughout the digits of the upper extremities. Reflexes are symmetrical and without evidence of pathologic reflexes. Patient has a normal gait and stance.   Data Review:CBC    Component Value Date/Time   WBC 8.4 05/20/2012 1545   RBC 4.81 05/20/2012 1545   HGB 14.5 05/20/2012 1545   HCT 41.5 05/20/2012 1545   PLT 273 05/20/2012 1545   MCV 86.3 05/20/2012 1545   MCH 30.1 05/20/2012 1545   MCHC 34.9 05/20/2012 1545   RDW 12.3 05/20/2012 1545   LYMPHSABS 2.6 02/14/2007 0110   MONOABS 1.4* 02/14/2007 0110   EOSABS 0.3 02/14/2007 0110   BASOSABS 0.1 02/14/2007 0110                          BMET    Component Value Date/Time   NA 141 05/20/2012 1545   K 4.1 05/20/2012 1545   CL 102 05/20/2012  1545   CO2 30 05/20/2012 1545   GLUCOSE 101* 05/20/2012 1545   BUN 8 05/20/2012 1545   CREATININE 0.88 05/20/2012 1545   CALCIUM 9.6 05/20/2012 1545   GFRNONAA >90 05/20/2012 1545   GFRAA >90 05/20/2012 1545     Assessment/Plan: Patient with neck and left cervical radicular pain secondary to spondylitic disc herniations at C5-6 and C6-7 who is admitted for a 2 level C5-6 and C6-7 ACDF.  I've discussed with the patient the nature of his condition, the nature the surgical procedure, the typical length of surgery, hospital stay, and overall recuperation. We discussed limitations postoperatively. I discussed risks of surgery including risks of infection, bleeding, possibly need  for transfusion, the risk of nerve root dysfunction with pain, weakness, numbness, or paresthesias, the risk of spinal cord dysfunction with paralysis of all 4 limbs and quadriplegia, and the risk of dural tear and CSF leakage and possible need for further surgery, the risk of esophageal dysfunction causing dysphagia and the risk of laryngeal dysfunction causing hoarseness of the voice, the risk of failure of the arthrodesis and the possible need for further surgery, and the risk of anesthetic complications including myocardial infarction, stroke, pneumonia, and death. We also discussed the need for postoperative immobilization in a cervical collar. Understanding all this the patient does wish to proceed with surgery and is admitted for such.    Hewitt Shorts, MD 05/21/2012 7:32 AM

## 2012-05-21 NOTE — Op Note (Signed)
05/21/2012  10:20 AM  PATIENT:  [name]  46 y.o. male  PRE-OPERATIVE DIAGNOSIS:  Cervical five-six, six-seven herniated nucleus pulposus without myelopathy, degenerative disc disease,spondylosis without myelopathy, radiculopathy  POST-OPERATIVE DIAGNOSIS:  Cervical five-six, six-seven herniated nucleus pulposus without myelopathy, degenerative disc disease,spondylosis without myelopathy, radiculopathy  PROCEDURE:  Procedure(s): ANTERIOR CERVICAL DECOMPRESSION/DISCECTOMY FUSION 2 LEVELS:  C5-6 and C6-7 anterior cervical decompression and arthrodesis with allograft and tether cervical plating  SURGEON:  Surgeon(s): Hewitt Shorts, MD Carmela Hurt, MD  ASSISTANTS: Coletta Memos, M.D.  ANESTHESIA:   general  EBL:  Total I/O In: 1100 [I.V.:1100] Out: 125 [Blood:125]  BLOOD ADMINISTERED:none  COUNT: Correct per nursing staff  DICTATION: Patient was brought to the operating room placed under general endotracheal anesthesia. Patient was placed in 10 pounds of halter traction. The neck was prepped with Betadine soap and solution and draped in a sterile fashion. A horizontal incision was made on the left side of the neck. The line of the incision was infiltrated with local anesthetic with epinephrine. Dissection was carried down thru the subcutaneous tissue and platysma, bipolar cautery was used to maintain hemostasis. Dissection was then carried out thru an avascular plane leaving the sternocleidomastoid carotid artery and jugular vein laterally and the trachea and esophagus medially. The ventral aspect of the vertebral column was identified and a localizing x-ray was taken. The C5-6 and C6-7 levels were identified. The annulus at each level was incised and the disc space entered. Discectomy was performed with micro-curettes and pituitary rongeurs. The operating microscope was draped and brought into the field provided additional magnification illumination and visualization. Discectomy was  continued posteriorly thru the disc space and then the cartilaginous endplate was removed using micro-curettes along with the high-speed drill. Posterior osteophytic overgrowth was removed each level using the high-speed drill along with a 2 mm thin footplated Kerrison punch. Posterior longitudinal ligament along with disc herniation was carefully removed, decompressing the spinal canal and thecal sac. We then continued to remove osteophytic overgrowth and disc material decompressing the neural foramina and exiting nerve roots bilaterally. Once the decompression was completed hemostasis was established at each level with the use of Gelfoam with thrombin and bipolar cautery. The Gelfoam was removed the wound irrigated and hemostasis confirmed. We then measured the height of the intravertebral disc space level and selected a 7 millimeter in height structural allograft for the C5-6 level and a 7 millimeter in height structural allograft for the C6-7 level . Each was hydrated and saline solution and then gently positioned in the intravertebral disc space and countersunk. We then selected a 35 millimeter in height Tether cervical plate. It was positioned over the fusion construct and secured to the vertebra with a pair of 4 x 15 mm variable screws at the C5 level, a single 4 x 15 fixed screw at the C6 level, and a pair of 4 x 15 mm variable screws at the C7 level. Each screw hole was started with the high-speed drill and then the screws placed, once all the screws were placed final tightening was performed. The wound was irrigated with bacitracin solution checked for hemostasis which was established and confirmed. An x-ray was taken which showed grafts in good position, the plate and screws in good position, and the overall alignment to be good. We then proceeded with closure. The platysma was closed with interrupted inverted 2-0 undyed Vicryl suture, the subcutaneous and subcuticular closed with interrupted inverted 3-0  undyed Vicryl suture. The skin edges were  approximated with Dermabond. Following surgery the patient was taken out of cervical traction. To be reversed and the anesthetic and taken to the recovery room for further care.   PLAN OF CARE: Admit for overnight observation  PATIENT DISPOSITION:  PACU - hemodynamically stable.   Delay start of Pharmacological VTE agent (>24hrs) due to surgical blood loss or risk of bleeding:  yes

## 2012-05-21 NOTE — Progress Notes (Signed)
Dr. Chaney Malling notified that pt's.right eye still feels like burning and feels something in it when he closed his right eye. Will comeby to check on pt.

## 2012-05-22 ENCOUNTER — Encounter (HOSPITAL_COMMUNITY): Payer: Self-pay | Admitting: *Deleted

## 2012-05-22 MED ORDER — OXYCODONE-ACETAMINOPHEN 5-325 MG PO TABS: 1.0000 | ORAL_TABLET | ORAL | Status: DC | PRN

## 2012-05-22 NOTE — Discharge Summary (Signed)
Physician Discharge Summary  Patient ID: [name] MRN: [id number] DOB/AGE: [id number] y.o.  Admit date: 05/21/2012 Discharge date: 05/22/2012  Admission Diagnoses:  Cervical disc herniation, cervical spondylosis, cervical degenerative disc disease, cervical radiculopathy   Discharge Diagnoses:  Cervical disc herniation, cervical spondylosis, cervical degenerative disc disease, cervical radiculopathy  Discharged Condition: good  Hospital Course: Patient was admitted underwent a 2 level CV-6 and C6-7 ACDF. He has done well following surgery. He is up and living actively in the halls. His wound is clean and dry, and healing well. He is being discharged home with instructions regarding wound care and activities. He is to followup with me in the office in 3 weeks.  Discharge Exam: Blood pressure 111/61, pulse 70, temperature 98 F (36.7 C), temperature source Oral, resp. rate 18, height 5\' 8"  (1.727 m), weight 87.998 kg (194 lb), SpO2 99.00%.  Disposition: Home     Medication List     As of 05/22/2012  9:14 AM    TAKE these medications         ALPRAZolam 0.5 MG tablet   Commonly known as: XANAX   Take 1 tablet by mouth Three times daily as needed. For anxiety      HYDROcodone-acetaminophen 7.5-325 MG per tablet   Commonly known as: NORCO   Take 1 tablet by mouth Every 8 hours as needed. For pain      oxyCODONE-acetaminophen 5-325 MG per tablet   Commonly known as: PERCOCET/ROXICET   Take 1-2 tablets by mouth every 4 (four) hours as needed for pain.         Signed: Hewitt Shorts, MD 05/22/2012, 9:14 AM

## 2012-05-24 ENCOUNTER — Encounter (HOSPITAL_COMMUNITY): Payer: Self-pay | Admitting: Neurosurgery

## 2012-06-28 ENCOUNTER — Encounter (HOSPITAL_COMMUNITY): Payer: Self-pay | Admitting: Emergency Medicine

## 2012-06-28 ENCOUNTER — Emergency Department (HOSPITAL_COMMUNITY)
Admission: EM | Admit: 2012-06-28 | Discharge: 2012-06-28 | Disposition: A | Discharge status: Home Health | Payer: BC Managed Care – PPO | Attending: Emergency Medicine | Admitting: Emergency Medicine

## 2012-06-28 DIAGNOSIS — E785 Hyperlipidemia, unspecified: Secondary | ICD-10-CM | POA: Insufficient documentation

## 2012-06-28 DIAGNOSIS — Z791 Long term (current) use of non-steroidal anti-inflammatories (NSAID): Secondary | ICD-10-CM | POA: Insufficient documentation

## 2012-06-28 DIAGNOSIS — Z8701 Personal history of pneumonia (recurrent): Secondary | ICD-10-CM | POA: Insufficient documentation

## 2012-06-28 DIAGNOSIS — Z981 Arthrodesis status: Secondary | ICD-10-CM | POA: Insufficient documentation

## 2012-06-28 DIAGNOSIS — Z8659 Personal history of other mental and behavioral disorders: Secondary | ICD-10-CM | POA: Insufficient documentation

## 2012-06-28 DIAGNOSIS — M542 Cervicalgia: Secondary | ICD-10-CM | POA: Insufficient documentation

## 2012-06-28 MED ORDER — OXYCODONE-ACETAMINOPHEN 5-325 MG PO TABS: 2.0000 | ORAL_TABLET | ORAL | Status: DC | PRN

## 2012-06-28 MED ORDER — OXYCODONE-ACETAMINOPHEN 5-325 MG PO TABS
2.0000 | ORAL_TABLET | Freq: Once | ORAL | Status: AC
  Administered 2012-06-28: 2 via ORAL
  Filled 2012-06-28: qty 2

## 2012-06-28 NOTE — ED Notes (Signed)
Had surgery 5 weeks on his neck . Was relesed was sleeping in recliner last Tuesday had pain in neck left side sharp. Called office on Friday to tell then about popping and sharp pain . Tried to sleep in bed last night but it hurt to bad

## 2012-06-28 NOTE — ED Provider Notes (Signed)
History     CSN: [id number]  Arrival date & time 06/28/12  1308   First MD Initiated Contact with Patient 06/28/12 [phone]      No chief complaint on file.   (Consider location/radiation/quality/duration/timing/severity/associated sxs/prior treatment) HPI Comments: Patient is a 46 year old male who is s/p anterior cervical decompression/discectomy fusion about 5 weeks ago who presents with neck pain. The pain has been constant since the surgery. Patient reports gradual onset of the pain after surgery and more severe pain the past few days. Patient reports trying to sleep in bed rather than a recliner last night, which he has been doing which did not help with the pain. Pain is sharp and shooting and localized to cervical spine. Patient reports associated "popping" with neck movement. Patient reports neck movement has been limited since the surgery. Patient has tried Vicodin for the pain which does not help. Neck movement makes the pain worse. No alleviating symptoms. Patient denies numbness/tingling of hands.    Past Medical History  Diagnosis Date  . Hyperlipidemia     controlled with diet  . Pneumonia     hx of  . Anxiety     "mostly to unwind and go to sleep, works third shift"    Past Surgical History  Procedure Date  . Appendectomy   . Hernia repair     umbilical  . Anterior cervical decomp/discectomy fusion 05/21/2012    Procedure: ANTERIOR CERVICAL DECOMPRESSION/DISCECTOMY FUSION 2 LEVELS;  Surgeon: Hewitt Shorts, MD;  Location: [facility];  Service: Neurosurgery;  Laterality: Bilateral;  Cervical five-six, six-seven Anterior cervical decompression/diskectomy/fusion    No family history on file.  History  Substance Use Topics  . Smoking status: Never Smoker   . Smokeless tobacco: Not on file  . Alcohol Use: Yes     Comment: occasional      Review of Systems  HENT: Positive for neck pain and neck stiffness.   All other systems reviewed and are  negative.    Allergies  Review of patient's allergies indicates no known allergies.  Home Medications   Current Outpatient Rx  Name  Route  Sig  Dispense  Refill  . HYDROCODONE-ACETAMINOPHEN 7.5-325 MG PO TABS   Oral   Take 1 tablet by mouth Every 8 hours as needed. For pain         . MELOXICAM 15 MG PO TABS   Oral   Take 15 mg by mouth 2 (two) times daily.           BP 126/81  Pulse 67  Temp 98 F (36.7 C)  Resp 20  SpO2 99%  Physical Exam  Nursing note and vitals reviewed. Constitutional: He is oriented to person, place, and time. He appears well-developed and well-nourished. No distress.  HENT:  Head: Normocephalic and atraumatic.  Eyes: Conjunctivae normal are normal.  Neck:       ROM limited due to pain from recent surgery.   Cardiovascular: Normal rate and regular rhythm.  Exam reveals no gallop and no friction rub.   No murmur heard. Pulmonary/Chest: Effort normal and breath sounds normal. He has no wheezes. He has no rales. He exhibits no tenderness.  Abdominal: Soft. There is no tenderness.  Musculoskeletal: Normal range of motion.  Neurological: He is alert and oriented to person, place, and time. Coordination normal.       Speech is goal-oriented. Moves limbs without ataxia.   Skin: Skin is warm and dry. He is not diaphoretic.  Psychiatric: He has a normal mood and affect. His behavior is normal.    ED Course  Procedures (including critical care time)  Labs Reviewed - No data to display No results found.   1. Spine pain, cervical       MDM  8:05 AM Patient will have percocet for pain.   9:34 AM Patient reports feeling much better after percocet. I spoke with Dr. Earl Gala office who will try to work the patient in to a sooner appointment for follow up. Secretary reports the office will get in touch with the patient. I will give the patient percocet prescription for pain relief. Patient is afebrile and denies any injuries or new pain.        Emilia Beck, PA-C 06/28/12 1529

## 2012-06-30 NOTE — ED Provider Notes (Signed)
Medical screening examination/treatment/procedure(s) were performed by non-physician practitioner and as supervising physician I was immediately available for consultation/collaboration.     Shelda Jakes, MD 06/30/12 [phone]

## 2013-06-26 ENCOUNTER — Encounter (HOSPITAL_COMMUNITY): Payer: Self-pay | Admitting: Emergency Medicine

## 2013-06-26 ENCOUNTER — Emergency Department (HOSPITAL_COMMUNITY)
Admission: EM | Admit: 2013-06-26 | Discharge: 2013-06-26 | Disposition: A | Discharge status: Home / Self Care | Payer: BC Managed Care – PPO | Attending: Emergency Medicine | Admitting: Emergency Medicine

## 2013-06-26 DIAGNOSIS — L02413 Cutaneous abscess of right upper limb: Secondary | ICD-10-CM

## 2013-06-26 DIAGNOSIS — Z862 Personal history of diseases of the blood and blood-forming organs and certain disorders involving the immune mechanism: Secondary | ICD-10-CM | POA: Insufficient documentation

## 2013-06-26 DIAGNOSIS — Z8701 Personal history of pneumonia (recurrent): Secondary | ICD-10-CM | POA: Insufficient documentation

## 2013-06-26 DIAGNOSIS — Z791 Long term (current) use of non-steroidal anti-inflammatories (NSAID): Secondary | ICD-10-CM | POA: Insufficient documentation

## 2013-06-26 DIAGNOSIS — Z8614 Personal history of Methicillin resistant Staphylococcus aureus infection: Secondary | ICD-10-CM | POA: Insufficient documentation

## 2013-06-26 DIAGNOSIS — Z8659 Personal history of other mental and behavioral disorders: Secondary | ICD-10-CM | POA: Insufficient documentation

## 2013-06-26 DIAGNOSIS — IMO0002 Reserved for concepts with insufficient information to code with codable children: Secondary | ICD-10-CM | POA: Insufficient documentation

## 2013-06-26 DIAGNOSIS — Z8639 Personal history of other endocrine, nutritional and metabolic disease: Secondary | ICD-10-CM | POA: Insufficient documentation

## 2013-06-26 MED ORDER — POVIDONE-IODINE 10 % EX SOLN
CUTANEOUS | Status: AC
  Filled 2013-06-26: qty 118

## 2013-06-26 MED ORDER — DOXYCYCLINE HYCLATE 100 MG PO TABS
100.0000 mg | ORAL_TABLET | Freq: Once | ORAL | Status: AC
  Administered 2013-06-26: 100 mg via ORAL
  Filled 2013-06-26: qty 1

## 2013-06-26 MED ORDER — HYDROCODONE-ACETAMINOPHEN 5-325 MG PO TABS: 1.0000 | ORAL_TABLET | Freq: Four times a day (QID) | ORAL | Status: DC | PRN

## 2013-06-26 MED ORDER — DOXYCYCLINE HYCLATE 100 MG PO CAPS: 100.0000 mg | ORAL_CAPSULE | Freq: Two times a day (BID) | ORAL | Status: DC

## 2013-06-26 MED ORDER — LIDOCAINE HCL (PF) 1 % IJ SOLN
INTRAMUSCULAR | Status: AC
  Filled 2013-06-26: qty 5

## 2013-06-26 MED ORDER — DOXYCYCLINE HYCLATE 100 MG IV SOLR
100.0000 mg | Freq: Once | INTRAVENOUS | Status: DC
  Filled 2013-06-26: qty 100

## 2013-06-26 NOTE — ED Provider Notes (Signed)
CSN: [id number]     Arrival date & time 06/26/13  0610 History   First MD Initiated Contact with Patient 06/26/13 [phone]     Chief Complaint  Patient presents with  . Abscess   (Consider location/radiation/quality/duration/timing/severity/associated sxs/prior Treatment) HPI Is a 47 year old male with a history of MRSA. He has about a 3 week history of a knot over his distal left upper arm, just proximal to the antecubital fossa. For the past 4 days it has become erythematous, swollen and tender. The pain is severe enough to prevent him from fully extending his forearm at the right elbow. Pain is worse with palpation or movement.   Past Medical History  Diagnosis Date  . Hyperlipidemia     controlled with diet  . Pneumonia     hx of  . Anxiety     "mostly to unwind and go to sleep, works third shift"   Past Surgical History  Procedure Laterality Date  . Appendectomy    . Hernia repair      umbilical  . Anterior cervical decomp/discectomy fusion  05/21/2012    Procedure: ANTERIOR CERVICAL DECOMPRESSION/DISCECTOMY FUSION 2 LEVELS;  Surgeon: Hewitt Shorts, MD;  Location: [facility];  Service: Neurosurgery;  Laterality: Bilateral;  Cervical five-six, six-seven Anterior cervical decompression/diskectomy/fusion   No family history on file. History  Substance Use Topics  . Smoking status: Never Smoker   . Smokeless tobacco: Not on file  . Alcohol Use: Yes     Comment: occasional    Review of Systems  All other systems reviewed and are negative.    Allergies  Review of patient's allergies indicates no known allergies.  Home Medications   Current Outpatient Rx  Name  Route  Sig  Dispense  Refill  . HYDROcodone-acetaminophen (NORCO) 7.5-325 MG per tablet   Oral   Take 1 tablet by mouth Every 8 hours as needed. For pain         . meloxicam (MOBIC) 15 MG tablet   Oral   Take 15 mg by mouth 2 (two) times daily.         Marland Kitchen oxyCODONE-acetaminophen (PERCOCET/ROXICET)  5-325 MG per tablet   Oral   Take 2 tablets by mouth every 4 (four) hours as needed for pain.   30 tablet   0    BP 120/84  Pulse 66  Temp(Src) 98.1 F (36.7 C) (Oral)  Resp 22  Ht 5\' 8"  (1.727 m)  Wt 185 lb (83.915 kg)  BMI 28.14 kg/m2  SpO2 97%  Physical Exam General: Well-developed, well-nourished male in no acute distress; appearance consistent with age of record HENT: normocephalic; atraumatic Eyes: pupils equal, round and reactive to light; extraocular muscles intact Neck: supple Heart: regular rate and rhythm Lungs: Normal respiratory effort and excursion Abdomen: soft; nondistended Extremities: No deformity; full range of motion; pulses normal Neurologic: Awake, alert and oriented; motor function intact in all extremities and symmetric; no facial droop Skin: Warm and dry; tender, erythematous nodule of the distal left upper arm, just proximal to the antecubital fossa, surrounding erythema with a diameter of about 8 cm Psychiatric: Normal mood and affect    ED Course  Procedures (including critical care time)  INCISION AND DRAINAGE Performed by: Hanley Seamen Consent: Verbal consent obtained. Risks and benefits: risks, benefits and alternatives were discussed Type: abscess  Body area: Left upper arm  Anesthesia: local infiltration  Incision was made with a scalpel.  Local anesthetic: lidocaine 2% without epinephrine  Anesthetic total:  2 ml  Complexity: complex Blunt dissection to break up loculations  Drainage: purulent plus a small amount of curd-like material but with no obvious cyst wall to excise   Drainage amount: Moderate   Packing material: None   Patient tolerance: Patient tolerated the procedure well with no immediate complications.     MDM  We'll place on doxycycline given Versed history and presence of surrounding erythema consistent with cellulitis. If this represents an inflamed or infected epidermoid cyst there was no obvious cyst  wall to dissect out. The patient and his wife were advised that there is a risk of recurrence. If this happens he may require wider excision by a dermatologist or surgeon.    Hanley Seamen, MD 06/26/13 [phone]

## 2013-06-26 NOTE — ED Notes (Signed)
inflamed area inner left upper arm for 4 days

## 2013-07-06 MED FILL — Hydrocodone-Acetaminophen Tab 5-325 MG: ORAL | Qty: 6 | Status: AC

## 2014-05-31 ENCOUNTER — Other Ambulatory Visit (HOSPITAL_COMMUNITY): Payer: Self-pay | Admitting: Physician Assistant

## 2014-05-31 ENCOUNTER — Ambulatory Visit (HOSPITAL_COMMUNITY)
Admission: RE | Admit: 2014-05-31 | Discharge: 2014-05-31 | Disposition: A | Discharge status: Home / Self Care | Payer: BC Managed Care – PPO | Source: Ambulatory Visit | Attending: Physician Assistant | Admitting: Physician Assistant

## 2014-05-31 DIAGNOSIS — R0602 Shortness of breath: Secondary | ICD-10-CM | POA: Diagnosis present

## 2014-05-31 DIAGNOSIS — Z0000 Encounter for general adult medical examination without abnormal findings: Secondary | ICD-10-CM

## 2015-02-17 ENCOUNTER — Emergency Department (HOSPITAL_COMMUNITY)
Admission: EM | Admit: 2015-02-17 | Discharge: 2015-02-17 | Disposition: A | Discharge status: Home / Self Care | Payer: BLUE CROSS/BLUE SHIELD | Attending: Emergency Medicine | Admitting: Emergency Medicine

## 2015-02-17 ENCOUNTER — Emergency Department (HOSPITAL_COMMUNITY): Payer: BLUE CROSS/BLUE SHIELD

## 2015-02-17 ENCOUNTER — Encounter (HOSPITAL_COMMUNITY): Payer: Self-pay | Admitting: *Deleted

## 2015-02-17 DIAGNOSIS — Z8639 Personal history of other endocrine, nutritional and metabolic disease: Secondary | ICD-10-CM | POA: Diagnosis not present

## 2015-02-17 DIAGNOSIS — J189 Pneumonia, unspecified organism: Secondary | ICD-10-CM

## 2015-02-17 DIAGNOSIS — F419 Anxiety disorder, unspecified: Secondary | ICD-10-CM | POA: Insufficient documentation

## 2015-02-17 DIAGNOSIS — J159 Unspecified bacterial pneumonia: Secondary | ICD-10-CM | POA: Diagnosis not present

## 2015-02-17 DIAGNOSIS — R05 Cough: Secondary | ICD-10-CM | POA: Diagnosis present

## 2015-02-17 MED ORDER — ALBUTEROL SULFATE HFA 108 (90 BASE) MCG/ACT IN AERS
2.0000 | INHALATION_SPRAY | RESPIRATORY_TRACT | Status: DC | PRN
  Administered 2015-02-17: 2 via RESPIRATORY_TRACT
  Filled 2015-02-17: qty 6.7

## 2015-02-17 MED ORDER — HYDROCOD POLST-CPM POLST ER 10-8 MG/5ML PO SUER
5.0000 mL | Freq: Once | ORAL | Status: AC
  Administered 2015-02-17: 5 mL via ORAL
  Filled 2015-02-17: qty 5

## 2015-02-17 MED ORDER — LIDOCAINE HCL (PF) 1 % IJ SOLN
INTRAMUSCULAR | Status: AC
  Administered 2015-02-17: 2.1 mL
  Filled 2015-02-17: qty 5

## 2015-02-17 MED ORDER — CEFTRIAXONE SODIUM 1 G IJ SOLR
1.0000 g | Freq: Once | INTRAMUSCULAR | Status: AC
  Administered 2015-02-17: 1 g via INTRAMUSCULAR
  Filled 2015-02-17: qty 10

## 2015-02-17 MED ORDER — AZITHROMYCIN 250 MG PO TABS: ORAL_TABLET | ORAL | Status: DC

## 2015-02-17 MED ORDER — IBUPROFEN 800 MG PO TABS
800.0000 mg | ORAL_TABLET | Freq: Once | ORAL | Status: AC
  Administered 2015-02-17: 800 mg via ORAL
  Filled 2015-02-17: qty 1

## 2015-02-17 MED ORDER — HYDROCOD POLST-CPM POLST ER 10-8 MG/5ML PO SUER: 5.0000 mL | Freq: Two times a day (BID) | ORAL | Status: DC | PRN

## 2015-02-17 NOTE — ED Notes (Addendum)
Productive cough clear in color, fever, chills, body aches x 4 days. Denies SOB.

## 2015-02-17 NOTE — Discharge Instructions (Signed)

## 2015-02-19 NOTE — ED Provider Notes (Signed)
CSN: [id number]     Arrival date & time 02/17/15  1113 History   First MD Initiated Contact with Patient 02/17/15 1143     Chief Complaint  Patient presents with  . Cough     (Consider location/radiation/quality/duration/timing/severity/associated sxs/prior Treatment) Patient is a 49 y.o. male presenting with cough. The history is provided by the patient and the spouse.  Cough Associated symptoms: chills, fever, myalgias and wheezing   Associated symptoms: no chest pain, no headaches, no rash, no shortness of breath and no sore throat    [name] is a 49 y.o. male with a past medical history significant for prior distant history of pneumonia presenting with a 4 day history of cough productive of clear sputum, chills and body aches, subjective fevers and fatigue reminding him of his last pneumonia infection.  He has taken otc cough medication and tylenol with no significant changes.  He denies sob, chest pain, nausea, dizziness or palpitations.  He does endorse occasional wheezing, mostly at night but not currently.  He does not smoke.  No foreign travel.    Past Medical History  Diagnosis Date  . Hyperlipidemia     controlled with diet  . Pneumonia     hx of  . Anxiety     "mostly to unwind and go to sleep, works third shift"   Past Surgical History  Procedure Laterality Date  . Appendectomy    . Hernia repair      umbilical  . Anterior cervical decomp/discectomy fusion  05/21/2012    Procedure: ANTERIOR CERVICAL DECOMPRESSION/DISCECTOMY FUSION 2 LEVELS;  Surgeon: Hewitt Shorts, MD;  Location: [facility];  Service: Neurosurgery;  Laterality: Bilateral;  Cervical five-six, six-seven Anterior cervical decompression/diskectomy/fusion   No family history on file. Social History  Substance Use Topics  . Smoking status: Never Smoker   . Smokeless tobacco: None  . Alcohol Use: Yes     Comment: occasional    Review of Systems  Constitutional: Positive for fever, chills  and fatigue.  HENT: Negative for congestion and sore throat.   Eyes: Negative.   Respiratory: Positive for cough and wheezing. Negative for chest tightness and shortness of breath.   Cardiovascular: Negative for chest pain.  Gastrointestinal: Negative for nausea and abdominal pain.  Genitourinary: Negative.   Musculoskeletal: Positive for myalgias. Negative for joint swelling, arthralgias and neck pain.  Skin: Negative.  Negative for rash and wound.  Neurological: Negative for dizziness, weakness, light-headedness, numbness and headaches.  Psychiatric/Behavioral: Negative.       Allergies  Review of patient's allergies indicates no known allergies.  Home Medications   Prior to Admission medications   Medication Sig Start Date End Date Taking? Authorizing Provider  escitalopram (LEXAPRO) 10 MG tablet Take 10 mg by mouth at bedtime. 02/07/15  Yes Historical Provider, MD  azithromycin (ZITHROMAX Z-PAK) 250 MG tablet Take 2 tablets by mouth on day one followed by one tablet daily for 4 days. 02/17/15   Burgess Amor, PA-C  chlorpheniramine-HYDROcodone (TUSSIONEX PENNKINETIC ER) 10-8 MG/5ML SUER Take 5 mLs by mouth every 12 (twelve) hours as needed for cough. 02/17/15   Burgess Amor, PA-C  doxycycline (VIBRAMYCIN) 100 MG capsule Take 1 capsule (100 mg total) by mouth 2 (two) times daily. One po bid x 7 days Patient not taking: Reported on 02/17/2015 06/26/13   Paula Libra, MD  HYDROcodone-acetaminophen (NORCO) 5-325 MG per tablet Take 1-2 tablets by mouth every 6 (six) hours as needed (for pain). Patient not taking:  Reported on 02/17/2015 06/26/13   Paula Libra, MD  oxyCODONE-acetaminophen (PERCOCET/ROXICET) 5-325 MG per tablet Take 2 tablets by mouth every 4 (four) hours as needed for pain. Patient not taking: Reported on 02/17/2015 06/28/12   Emilia Beck, PA-C   BP 117/95 mmHg  Pulse 84  Temp(Src) 99 F (37.2 C) (Oral)  Resp 16  Ht 5\' 8"  (1.727 m)  Wt 205 lb (92.987 kg)  BMI 31.18 kg/m2   SpO2 94% Physical Exam  Constitutional: He appears well-developed and well-nourished.  HENT:  Head: Normocephalic and atraumatic.  Eyes: Conjunctivae are normal.  Neck: Normal range of motion.  Cardiovascular: Normal rate, regular rhythm, normal heart sounds and intact distal pulses.   Pulmonary/Chest: Effort normal. No respiratory distress. He has no decreased breath sounds. He has no wheezes. He has rhonchi in the left lower field. He exhibits no tenderness.  Abdominal: Soft. Bowel sounds are normal. There is no tenderness.  Musculoskeletal: Normal range of motion.  Neurological: He is alert.  Skin: Skin is warm and dry.  Psychiatric: He has a normal mood and affect.  Nursing note and vitals reviewed.   ED Course  Procedures (including critical care time) Labs Review Labs Reviewed - No data to display  Imaging Review  Dg Chest 2 View  02/17/2015   CLINICAL DATA:  Cough  EXAM: CHEST  2 VIEW  COMPARISON:  05/31/2014  FINDINGS: Cardiomediastinal silhouette is stable. There is streaky infiltrate/pneumonia in left lower lobe retrocardiac. Right lung is clear.  IMPRESSION: Streaky infiltrate/pneumonia in left lower lobe retrocardiac.   Electronically Signed   By: Natasha Mead M.D.   On: 02/17/2015 12:20      EKG Interpretation None      MDM   Final diagnoses:  CAP (community acquired pneumonia)     Radiological studies were viewed, interpreted and considered during the medical decision making and disposition process. I agree with radiologists reading.  Results were also discussed with patient.   Pt was placed on zithromax, given rocephin IM prior to dc.  Tussionex, albuterol mdi (given).  Rest, increased fluid intake, f/u with pcp after abx completed, sooner (or here) if sx worsen including weakness or sob.  Pt is not sob, VSS, no respiratory distress. Candidate for OP tx.  The patient appears reasonably screened and/or stabilized for discharge and I doubt any other medical  condition or other [facility] requiring further screening, evaluation, or treatment in the ED at this time prior to discharge.     Burgess Amor, PA-C 02/19/15 1333  Burgess Amor, PA-C 02/19/15 1334  Bethann Berkshire, MD 02/19/15 1440

## 2015-03-01 ENCOUNTER — Emergency Department (HOSPITAL_COMMUNITY)
Admission: EM | Admit: 2015-03-01 | Discharge: 2015-03-01 | Disposition: A | Discharge status: Home / Self Care | Payer: BLUE CROSS/BLUE SHIELD | Attending: Emergency Medicine | Admitting: Emergency Medicine

## 2015-03-01 ENCOUNTER — Encounter (HOSPITAL_COMMUNITY): Payer: Self-pay

## 2015-03-01 ENCOUNTER — Emergency Department (HOSPITAL_COMMUNITY): Payer: BLUE CROSS/BLUE SHIELD

## 2015-03-01 DIAGNOSIS — R109 Unspecified abdominal pain: Secondary | ICD-10-CM | POA: Diagnosis not present

## 2015-03-01 DIAGNOSIS — F419 Anxiety disorder, unspecified: Secondary | ICD-10-CM | POA: Diagnosis not present

## 2015-03-01 DIAGNOSIS — Z8701 Personal history of pneumonia (recurrent): Secondary | ICD-10-CM | POA: Insufficient documentation

## 2015-03-01 DIAGNOSIS — R52 Pain, unspecified: Secondary | ICD-10-CM

## 2015-03-01 DIAGNOSIS — Z8639 Personal history of other endocrine, nutritional and metabolic disease: Secondary | ICD-10-CM | POA: Diagnosis not present

## 2015-03-01 DIAGNOSIS — M549 Dorsalgia, unspecified: Secondary | ICD-10-CM | POA: Diagnosis present

## 2015-03-01 DIAGNOSIS — Z79899 Other long term (current) drug therapy: Secondary | ICD-10-CM | POA: Insufficient documentation

## 2015-03-01 LAB — COMPREHENSIVE METABOLIC PANEL
ALT: 32 U/L (ref 17–63)
AST: 23 U/L (ref 15–41)
Albumin: 4.1 g/dL (ref 3.5–5.0)
Alkaline Phosphatase: 92 U/L (ref 38–126)
Anion gap: 7 (ref 5–15)
BUN: 11 mg/dL (ref 6–20)
CHLORIDE: 102 mmol/L (ref 101–111)
CO2: 29 mmol/L (ref 22–32)
CREATININE: 1.03 mg/dL (ref 0.61–1.24)
Calcium: 9.1 mg/dL (ref 8.9–10.3)
GFR calc non Af Amer: >60 mL/min (ref >60)
GLUCOSE: 99 mg/dL (ref 65–99)
Potassium: 4 mmol/L (ref 3.5–5.1)
SODIUM: 138 mmol/L (ref 135–145)
Total Bilirubin: 0.6 mg/dL (ref 0.3–1.2)
Total Protein: 7.4 g/dL (ref 6.5–8.1)

## 2015-03-01 LAB — CBC WITH DIFFERENTIAL/PLATELET
BASOS ABS: 0.1 10*3/uL (ref 0.0–0.1)
BASOS PCT: 1 % (ref 0–1)
EOS ABS: 0.6 10*3/uL (ref 0.0–0.7)
EOS PCT: 6 % — AB (ref 0–5)
HCT: 41.9 % (ref 39.0–52.0)
HEMOGLOBIN: 14.4 g/dL (ref 13.0–17.0)
LYMPHS ABS: 3.5 10*3/uL (ref 0.7–4.0)
Lymphocytes Relative: 34 % (ref 12–46)
MCH: 30.3 pg (ref 26.0–34.0)
MCHC: 34.4 g/dL (ref 30.0–36.0)
MCV: 88.2 fL (ref 78.0–100.0)
Monocytes Absolute: 0.9 10*3/uL (ref 0.1–1.0)
Monocytes Relative: 9 % (ref 3–12)
NEUTROS PCT: 50 % (ref 43–77)
Neutro Abs: 5.3 10*3/uL (ref 1.7–7.7)
PLATELETS: 329 10*3/uL (ref 150–400)
RBC: 4.75 MIL/uL (ref 4.22–5.81)
RDW: 12.5 % (ref 11.5–15.5)
WBC: 10.4 10*3/uL (ref 4.0–10.5)

## 2015-03-01 LAB — URINALYSIS, ROUTINE W REFLEX MICROSCOPIC
Bilirubin Urine: NEGATIVE
Glucose, UA: NEGATIVE mg/dL
Hgb urine dipstick: NEGATIVE
Ketones, ur: NEGATIVE mg/dL
Leukocytes, UA: NEGATIVE
NITRITE: NEGATIVE
PH: 6 (ref 5.0–8.0)
Protein, ur: NEGATIVE mg/dL
Specific Gravity, Urine: 1.025 (ref 1.005–1.030)
UROBILINOGEN UA: 0.2 mg/dL (ref 0.0–1.0)

## 2015-03-01 MED ORDER — KETOROLAC TROMETHAMINE 30 MG/ML IJ SOLN
30.0000 mg | Freq: Once | INTRAMUSCULAR | Status: AC
  Administered 2015-03-01: 30 mg via INTRAVENOUS
  Filled 2015-03-01: qty 1

## 2015-03-01 NOTE — Discharge Instructions (Signed)
Tylenol of motrin for pain.  Follow up with your md next week for recheck

## 2015-03-01 NOTE — ED Notes (Addendum)
Pt c/o sharp pain in lower back; recently treated for Pneumonia on 02/17/15

## 2015-03-05 NOTE — ED Provider Notes (Signed)
CSN: [id number]     Arrival date & time 03/01/15  1809 History   First MD Initiated Contact with Patient 03/01/15 1924     Chief Complaint  Patient presents with  . Back Pain     (Consider location/radiation/quality/duration/timing/severity/associated sxs/prior Treatment) Patient is a 49 y.o. male presenting with back pain. The history is provided by the patient (the pt complains of moderate right flank pain).  Back Pain Pain location: right flank pain. Quality:  Aching Radiates to:  Does not radiate Pain severity:  Moderate Pain is:  Worse during the day Onset quality:  Gradual Timing:  Constant Progression:  Waxing and waning Chronicity:  New Context: not emotional stress   Associated symptoms: no abdominal pain, no chest pain and no headaches     Past Medical History  Diagnosis Date  . Hyperlipidemia     controlled with diet  . Pneumonia     hx of  . Anxiety     "mostly to unwind and go to sleep, works third shift"   Past Surgical History  Procedure Laterality Date  . Appendectomy    . Hernia repair      umbilical  . Anterior cervical decomp/discectomy fusion  05/21/2012    Procedure: ANTERIOR CERVICAL DECOMPRESSION/DISCECTOMY FUSION 2 LEVELS;  Surgeon: Hewitt Shorts, MD;  Location: [facility];  Service: Neurosurgery;  Laterality: Bilateral;  Cervical five-six, six-seven Anterior cervical decompression/diskectomy/fusion   No family history on file. Social History  Substance Use Topics  . Smoking status: Never Smoker   . Smokeless tobacco: None  . Alcohol Use: Yes     Comment: occasional    Review of Systems  Constitutional: Negative for appetite change and fatigue.  HENT: Negative for congestion, ear discharge and sinus pressure.   Eyes: Negative for discharge.  Respiratory: Negative for cough.   Cardiovascular: Negative for chest pain.  Gastrointestinal: Negative for abdominal pain and diarrhea.  Genitourinary: Negative for frequency and hematuria.   Musculoskeletal: Positive for back pain.  Skin: Negative for rash.  Neurological: Negative for seizures and headaches.  Psychiatric/Behavioral: Negative for hallucinations.      Allergies  Review of patient's allergies indicates no known allergies.  Home Medications   Prior to Admission medications   Medication Sig Start Date End Date Taking? Authorizing Provider  escitalopram (LEXAPRO) 10 MG tablet Take 10 mg by mouth at bedtime. 02/07/15  Yes Historical Provider, MD  azithromycin (ZITHROMAX Z-PAK) 250 MG tablet Take 2 tablets by mouth on day one followed by one tablet daily for 4 days. Patient not taking: Reported on 03/01/2015 02/17/15   Burgess Amor, PA-C  chlorpheniramine-HYDROcodone [facility] ER) 10-8 MG/5ML SUER Take 5 mLs by mouth every 12 (twelve) hours as needed for cough. Patient not taking: Reported on 03/01/2015 02/17/15   Burgess Amor, PA-C  levofloxacin (LEVAQUIN) 750 MG tablet Take 750 mg by mouth daily.    Historical Provider, MD   BP 134/86 mmHg  Pulse 62  Temp(Src) 97.7 F (36.5 C) (Oral)  Resp 18  Ht 5\' 8"  (1.727 m)  Wt 205 lb (92.987 kg)  BMI 31.18 kg/m2  SpO2 94% Physical Exam  Constitutional: He is oriented to person, place, and time. He appears well-developed.  HENT:  Head: Normocephalic.  Eyes: Conjunctivae and EOM are normal. No scleral icterus.  Neck: Neck supple. No thyromegaly present.  Cardiovascular: Normal rate and regular rhythm.  Exam reveals no gallop and no friction rub.   No murmur heard. Pulmonary/Chest: No stridor. He has  no wheezes. He has no rales. He exhibits no tenderness.  Abdominal: He exhibits no distension. There is no tenderness. There is no rebound.  Musculoskeletal: Normal range of motion. He exhibits no edema.  Tender right flank  Lymphadenopathy:    He has no cervical adenopathy.  Neurological: He is oriented to person, place, and time. He exhibits normal muscle tone. Coordination normal.  Skin: No rash noted. No  erythema.  Psychiatric: He has a normal mood and affect. His behavior is normal.    ED Course  Procedures (including critical care time) Labs Review Labs Reviewed  CBC WITH DIFFERENTIAL/PLATELET - Abnormal; Notable for the following:    Eosinophils Relative 6 (*)    All other components within normal limits  COMPREHENSIVE METABOLIC PANEL  URINALYSIS, ROUTINE W REFLEX MICROSCOPIC (NOT AT [facility])    Imaging Review No results found. I have personally reviewed and evaluated these images and lab results as part of my medical decision-making.   EKG Interpretation None      MDM   Final diagnoses:  Pain  Flank pain    Right flank pain,  Unremarkable labs,  Ct shows renal cysts,  Suspect muscular skeletal.  Will tx symptoms and have pt follow up with pcp    Bethann Berkshire, MD 03/05/15 1015

## 2015-10-08 DIAGNOSIS — F339 Major depressive disorder, recurrent, unspecified: Secondary | ICD-10-CM | POA: Diagnosis not present

## 2015-10-08 DIAGNOSIS — F5101 Primary insomnia: Secondary | ICD-10-CM | POA: Diagnosis not present

## 2015-10-16 ENCOUNTER — Telehealth: Payer: Self-pay | Admitting: Orthopaedic Surgery

## 2015-10-16 DIAGNOSIS — S83282A Other tear of lateral meniscus, current injury, left knee, initial encounter: Secondary | ICD-10-CM | POA: Diagnosis not present

## 2015-10-16 NOTE — Telephone Encounter (Signed)
Call received from patient's wife on voice message regarding scheduling appointment for knee problem.  Called back to offer appointment to ph# received [phone], and no voice mail set up. Called patient directly to offer appointment and patient states he already called another doctor as he thought we may have been closed today.  I offered same day appointment for today also, and patient said it's been going on about 3 weeks, and thanked me, but said he would just go on to the other doctor.  Relayed we would be happy to help if anything further needed.

## 2015-10-23 ENCOUNTER — Other Ambulatory Visit (HOSPITAL_COMMUNITY): Payer: Self-pay | Admitting: Orthopedic Surgery

## 2015-10-23 DIAGNOSIS — M25562 Pain in left knee: Secondary | ICD-10-CM

## 2015-10-30 ENCOUNTER — Ambulatory Visit (HOSPITAL_COMMUNITY)
Admission: RE | Admit: 2015-10-30 | Discharge: 2015-10-30 | Disposition: A | Discharge status: Home / Self Care | Payer: BLUE CROSS/BLUE SHIELD | Source: Ambulatory Visit | Attending: Orthopedic Surgery | Admitting: Orthopedic Surgery

## 2015-10-30 DIAGNOSIS — M7122 Synovial cyst of popliteal space [Baker], left knee: Secondary | ICD-10-CM | POA: Diagnosis not present

## 2015-10-30 DIAGNOSIS — M25562 Pain in left knee: Secondary | ICD-10-CM | POA: Insufficient documentation

## 2015-10-30 DIAGNOSIS — M25462 Effusion, left knee: Secondary | ICD-10-CM | POA: Insufficient documentation

## 2015-10-30 DIAGNOSIS — S83282D Other tear of lateral meniscus, current injury, left knee, subsequent encounter: Secondary | ICD-10-CM | POA: Diagnosis not present

## 2016-01-17 DIAGNOSIS — Z0000 Encounter for general adult medical examination without abnormal findings: Secondary | ICD-10-CM | POA: Diagnosis not present

## 2016-01-28 DIAGNOSIS — F339 Major depressive disorder, recurrent, unspecified: Secondary | ICD-10-CM | POA: Diagnosis not present

## 2016-01-28 DIAGNOSIS — Z0000 Encounter for general adult medical examination without abnormal findings: Secondary | ICD-10-CM | POA: Diagnosis not present

## 2016-02-12 ENCOUNTER — Telehealth: Payer: Self-pay

## 2016-02-12 NOTE — Telephone Encounter (Signed)
Pt received a triage letter from DS. Please call him at [phone]

## 2016-02-14 ENCOUNTER — Telehealth: Payer: Self-pay

## 2016-02-14 NOTE — Telephone Encounter (Signed)
[phone]  Patient called to schedule tcs

## 2016-02-14 NOTE — Telephone Encounter (Signed)
See separate triage.  

## 2016-02-21 NOTE — Telephone Encounter (Signed)
Gastroenterology Pre-Procedure Review  Request Date: 02/14/2016 Requesting Physician: Dr. Dwana MelenaZack Cantu  PATIENT REVIEW QUESTIONS: The patient responded to the following health history questions as indicated:    1. Diabetes Melitis: no 2. Joint replacements in the past 12 months: no 3. Major health problems in the past 3 months: no 4. Has an artificial valve or MVP: no 5. Has a defibrillator: no 6. Has been advised in past to take antibiotics in advance of a procedure like teeth cleaning: no 7. Family history of colon cancer: no  8. Alcohol Use: YES   A couple of beers once or twice weekly 9. History of sleep apnea: no     MEDICATIONS & ALLERGIES:    Patient reports the following regarding taking any blood thinners:   Plavix? no Aspirin? no Coumadin? no  Patient confirms/reports the following medications:  Current Outpatient Prescriptions  Medication Sig Dispense Refill  . ALPRAZolam (XANAX) 0.5 MG tablet Take 0.5 mg by mouth daily. Pt said he takes one with his Lexapro to sleep since he works 3rd shift    . escitalopram (LEXAPRO) 10 MG tablet Take 10 mg by mouth at bedtime.     No current facility-administered medications for this visit.     Patient confirms/reports the following allergies:  No Known Allergies  No orders of the defined types were placed in this encounter.   AUTHORIZATION INFORMATION Primary Insurance:   ID #:  Group #:  Pre-Cert / Auth required:  Pre-Cert / Auth #:   Secondary Insurance:   ID #:   Group #: Pre-Cert / Auth required:  Pre-Cert / Auth #:   SCHEDULE INFORMATION: Procedure has been scheduled as follows:  Date:            Time:   Location:   This Gastroenterology Pre-Precedure Review Form is being routed to the following provider(s): R. Roetta SessionsMichael Rourk, MD

## 2016-02-25 NOTE — Telephone Encounter (Signed)
Recommend office visit. Will likely need Propofol due to meds and alcohol use.

## 2016-02-26 NOTE — Telephone Encounter (Signed)
LMOM for a return call.  

## 2016-02-27 NOTE — Telephone Encounter (Signed)
Pt was scheduled an appt for early Sept but will cancel and reschedule later since his [age] birthday will not be until end of Oct. He is on my list to call early Oct for OV appt.

## 2016-02-28 DIAGNOSIS — J069 Acute upper respiratory infection, unspecified: Secondary | ICD-10-CM | POA: Diagnosis not present

## 2016-03-12 ENCOUNTER — Ambulatory Visit: Payer: BLUE CROSS/BLUE SHIELD | Admitting: Gastroenterology

## 2016-04-24 ENCOUNTER — Telehealth: Payer: Self-pay

## 2016-04-24 NOTE — Telephone Encounter (Signed)
See triage

## 2016-04-24 NOTE — Telephone Encounter (Signed)
Pt called to set up his first colonoscopy. Please call [phone]

## 2016-04-29 ENCOUNTER — Telehealth: Payer: Self-pay | Admitting: Gastroenterology

## 2016-04-29 NOTE — Telephone Encounter (Signed)
See note from Lewie LoronAnna Boone, NP ( on the 02/14/2016 note). Pt needs OV due to meds and alcohol.  OV with Wynne DustEric Gill, NP on 05/12/2016 at 8:30 Am.

## 2016-04-29 NOTE — Telephone Encounter (Signed)
Patient called and stated that he has not yet received his instruction papers for his tcs

## 2016-05-12 ENCOUNTER — Encounter: Payer: Self-pay | Admitting: Nurse Practitioner

## 2016-05-12 ENCOUNTER — Ambulatory Visit (INDEPENDENT_AMBULATORY_CARE_PROVIDER_SITE_OTHER): Payer: BLUE CROSS/BLUE SHIELD | Admitting: Nurse Practitioner

## 2016-05-12 ENCOUNTER — Other Ambulatory Visit: Payer: Self-pay

## 2016-05-12 DIAGNOSIS — Z800 Family history of malignant neoplasm of digestive organs: Secondary | ICD-10-CM

## 2016-05-12 DIAGNOSIS — Z9229 Personal history of other drug therapy: Secondary | ICD-10-CM | POA: Diagnosis not present

## 2016-05-12 MED ORDER — PEG-KCL-NACL-NASULF-NA ASC-C 100 G PO SOLR: 1.0000 | ORAL | 0 refills | Status: DC

## 2016-05-12 NOTE — Assessment & Plan Note (Addendum)
Family history of colon cancer in his father who was diagnosed in his late 4960s, underwent surgical resection, a few years later had a recurrence with metastasis to liver and subsequently passed away approximately age 50. The patient is 50 years old and is due for his first-ever colonoscopy. He is generally asymptomatic from a GI standpoint. We will proceed based on guidelines and recommendations.  Proceed with colonoscopy on propofol/MAC with Dr. Jena Gaussourk in the near future. The risks, benefits, and alternatives have been discussed in detail with the patient. They state understanding and desire to proceed.   The patient is on Lexapro and Xanax. Drinks beer a couple times a week. No other anticoagulants, anxiolytics, chronic pain medications, or antidepressants. We will provide for the procedure on propofol/MAC to promote adequate sedation.

## 2016-05-12 NOTE — Progress Notes (Signed)
CC'ED TO PCP 

## 2016-05-12 NOTE — Patient Instructions (Signed)
1. We will schedule your procedure for you. 2. Further recommendations to be made based on the results of your procedure. 3. Return for follow-up based on the recommendations made after your colonoscopy.

## 2016-05-12 NOTE — Assessment & Plan Note (Signed)
Regular use of Lexapro and Xanax. We will augment his sedation with propofol/MAC as noted below.

## 2016-05-12 NOTE — Progress Notes (Addendum)
Primary Care Physician:  Jonathan Hall, MD Primary Gastroenterologist:  Dr. Jena GausDwana Cantu  Chief Complaint  Patient presents with  . Colonoscopy    never had one, no problems    HPI:   [name] is a [age] y.o. male who presents to schedule first-ever colonoscopy. The patient was initially phone triage but that was deferred to office visit due to medications and alcohol use which will likely require propofol.  Today he states he's doing well overall. Family history of colon ca in Father. Has never had a colonoscopy. Denies abdominal pain, N/V, fever, chills, sudden changes in bowel habits, hematochezia, melena, unintentional weight loss. Denies chest pain, dyspnea, dizziness, lightheadedness, syncope, near syncope. Denies any other upper or lower GI symptoms.  Past Medical History:  Diagnosis Date  . Anxiety    "mostly to unwind and go to sleep, works third shift"  . Hyperlipidemia    controlled with diet  . Pneumonia    hx of    Past Surgical History:  Procedure Laterality Date  . ANTERIOR CERVICAL DECOMP/DISCECTOMY FUSION  05/21/2012   Procedure: ANTERIOR CERVICAL DECOMPRESSION/DISCECTOMY FUSION 2 LEVELS;  Surgeon: Jonathan Shortsobert W Nudelman, MD;  Location: [facility];  Service: Neurosurgery;  Laterality: Bilateral;  Cervical five-six, six-seven Anterior cervical decompression/diskectomy/fusion  . APPENDECTOMY    . HERNIA REPAIR     umbilical    Current Outpatient Prescriptions  Medication Sig Dispense Refill  . ALPRAZolam (XANAX) 0.5 MG tablet Take 0.25 mg by mouth daily. Pt said he takes one with his Lexapro to sleep since he works 3rd shift     . escitalopram (LEXAPRO) 10 MG tablet Take 10 mg by mouth at bedtime.     No current facility-administered medications for this visit.     Allergies as of 05/12/2016  . (No Known Allergies)    Family History  Problem Relation Age of Onset  . Colon cancer Father [id number]    Surgical treatment, remission, recurrence with METS to Liver and has  passed away age 50    Social History   Social History  . Marital status: Married    Spouse name: N/A  . Number of children: N/A  . Years of education: N/A   Occupational History  . Not on file.   Social History Main Topics  . Smoking status: Never Smoker  . Smokeless tobacco: Never Used  . Alcohol use Yes     Comment: Beer 1-2 times a week  . Drug use: No  . Sexual activity: Not on file   Other Topics Concern  . Not on file   Social History Narrative  . No narrative on file    Review of Systems: General: Negative for anorexia, weight loss, fever, chills, fatigue, weakness. ENT: Negative for hoarseness, difficulty swallowing , nasal congestion. CV: Negative for chest pain, angina, palpitations, dyspnea on exertion, peripheral edema.  Respiratory: Negative for dyspnea at rest, cough, sputum, wheezing.  GI: See history of present illness. MS: Negative for joint pain, low back pain.  Derm: Negative for rash or itching.  Endo: Negative for unusual weight change.  Heme: Negative for bruising or bleeding. Allergy: Negative for rash or hives.   Physical Exam: BP 128/78   Pulse 66   Temp 97.7 F (36.5 C) (Oral)   Ht 5\' 8"  (1.727 m)   Wt 207 lb (93.9 kg)   BMI 31.47 kg/m  General:   Alert and oriented. Pleasant and cooperative. Well-nourished and well-developed.  Head:  Normocephalic and  atraumatic. Eyes:  Without icterus, sclera clear and conjunctiva pink.  Ears:  Normal auditory acuity. Cardiovascular:  S1, S2 present without murmurs appreciated. Extremities without clubbing or edema. Respiratory:  Clear to auscultation bilaterally. No wheezes, rales, or rhonchi. No distress.  Gastrointestinal:  +BS, soft, non-tender and non-distended. No HSM noted. No guarding or rebound. No masses appreciated.  Rectal:  Deferred  Musculoskalatal:  Symmetrical without gross deformities. Neurologic:  Alert and oriented x4;  grossly normal neurologically. Psych:  Alert and  cooperative. Normal mood and affect. Heme/Lymph/Immune: No excessive bruising noted.    05/12/2016 8:45 AM   Disclaimer: This note was dictated with voice recognition software. Similar sounding words can inadvertently be transcribed and may not be corrected upon review. 

## 2016-05-18 IMAGING — CT CT RENAL STONE PROTOCOL
2 of 4 series · 16 of 46 positions shown, 18 images · non-contrast
Comparison: 02/26/2002

CLINICAL DATA: Intermittent left flank pain for 2 weeks, worse
today.

EXAM:
CT ABDOMEN AND PELVIS WITHOUT CONTRAST
TECHNIQUE: Multidetector CT imaging of the abdomen and pelvis was performed
following the standard protocol without IV contrast.

[Series 2: standard/full over (age)lbs 5.0 · axial · 0.79mm/px · z∈[-478,-42]mm · 13 of 95 slices shown, 15 images]
[im 4/95  soft-tissue]
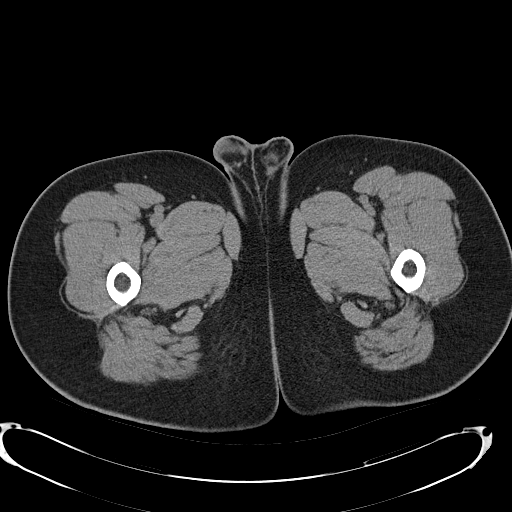
[im 4/95  bone]
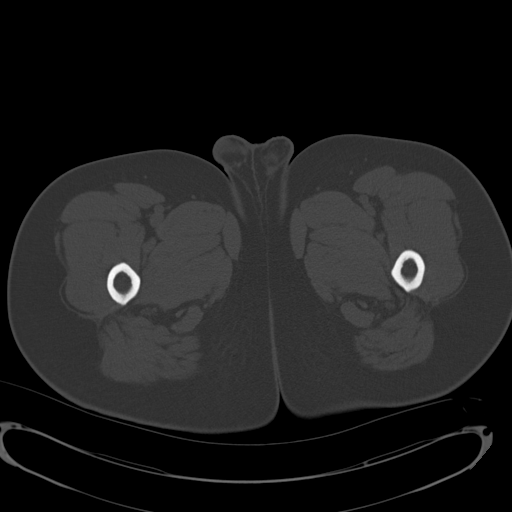
[im 11/95  soft-tissue]
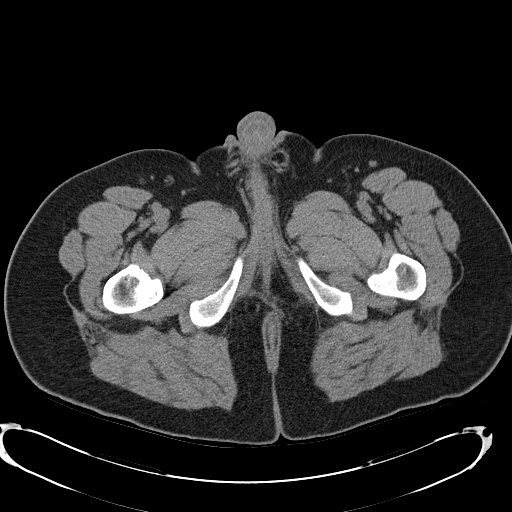
[im 19/95  soft-tissue]
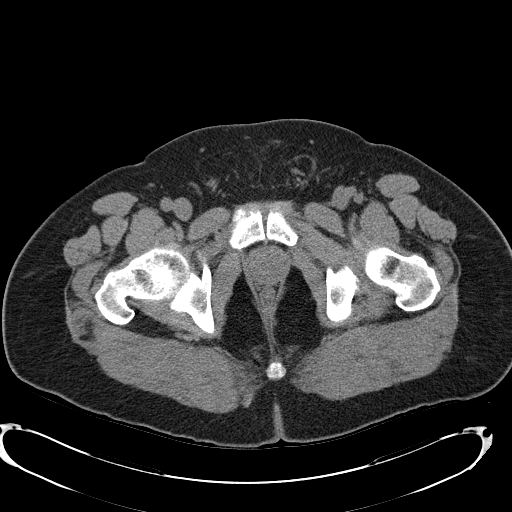
[im 26/95  soft-tissue]
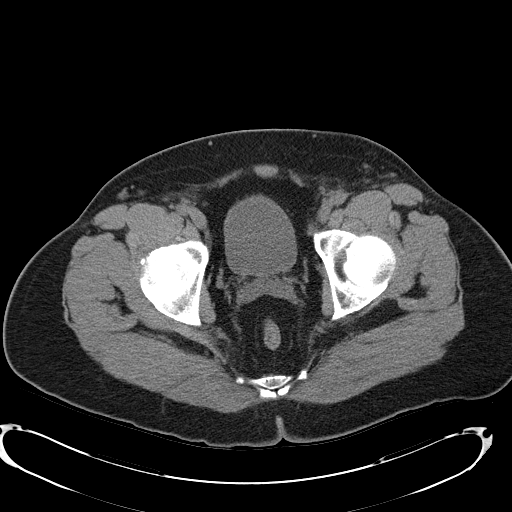
[im 33/95  soft-tissue]
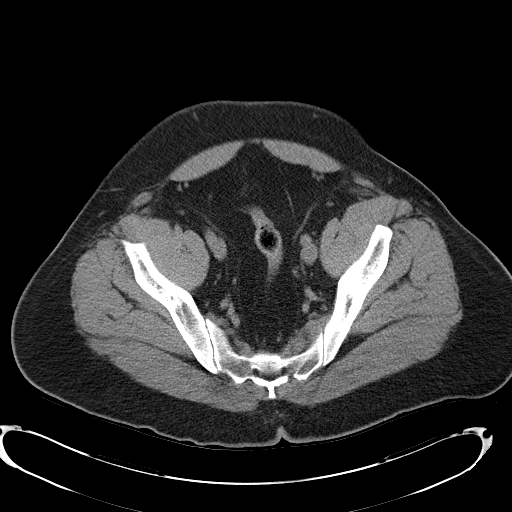
[im 40/95  soft-tissue]
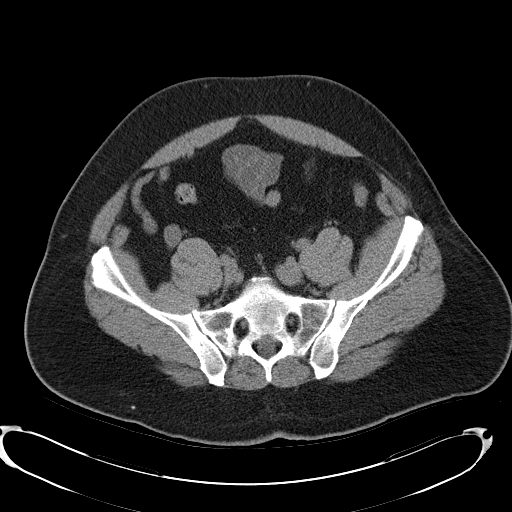
[im 48/95  soft-tissue]
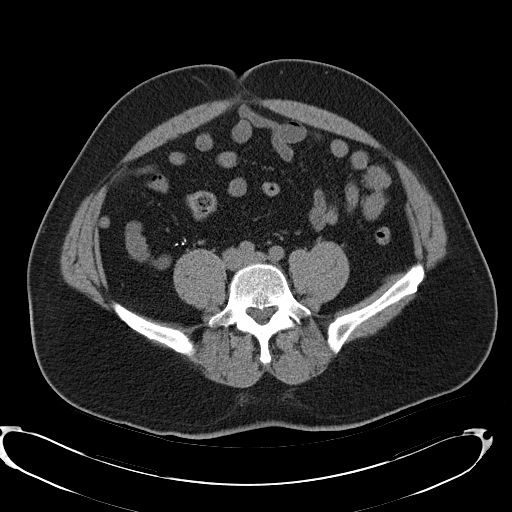
[im 55/95  soft-tissue]
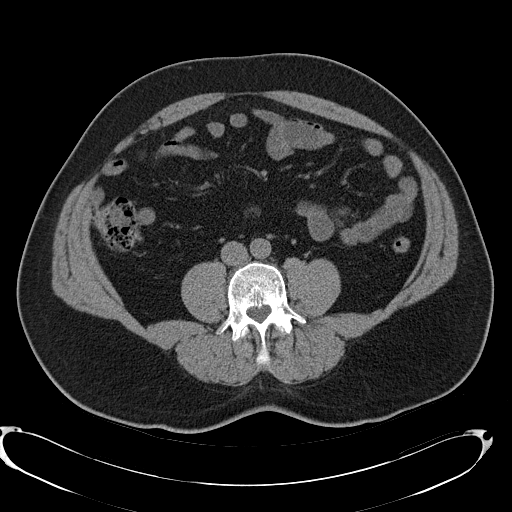
[im 62/95  soft-tissue]
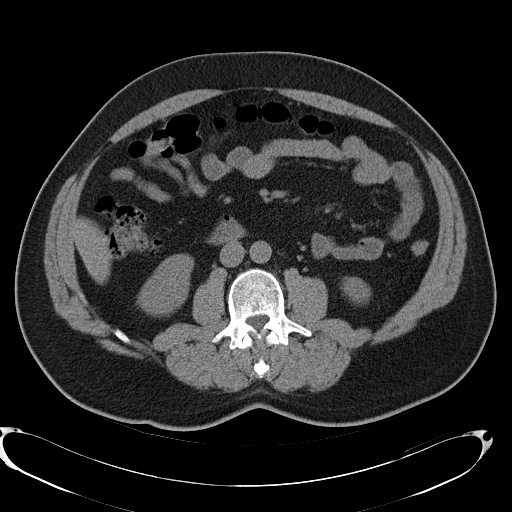
[im 62/95  bone]
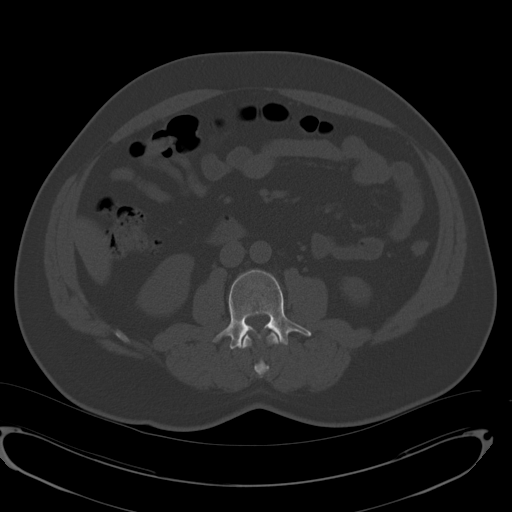
[im 69/95  soft-tissue]
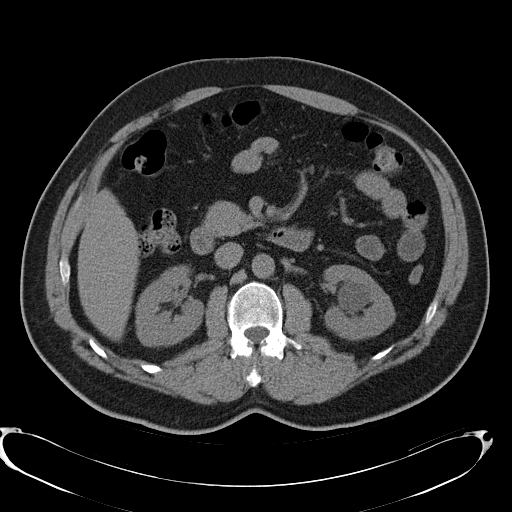
[im 76/95  soft-tissue]
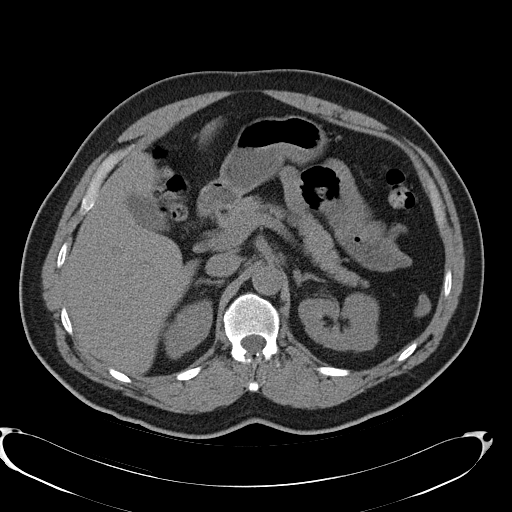
[im 84/95  soft-tissue]
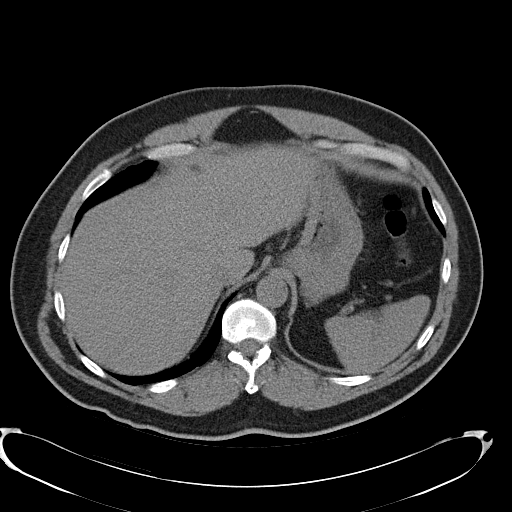
[im 91/95  soft-tissue]
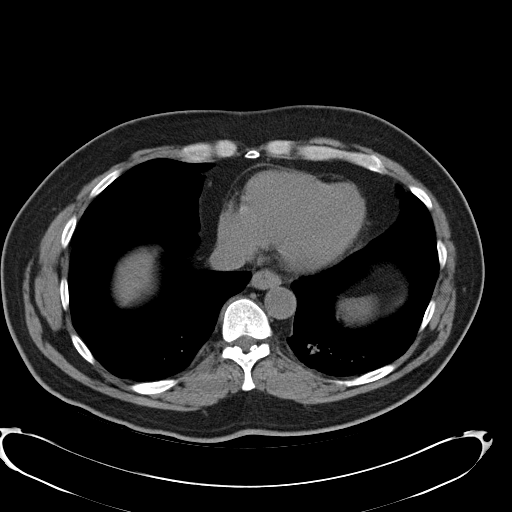

[Series 4: mpr coronal · coronal · 0.74mm/px · 3 of 95 slices shown]
[im 32/95  soft-tissue]
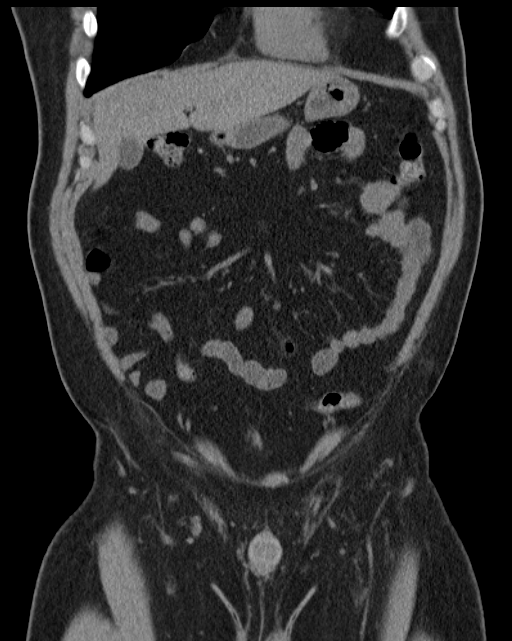
[im 42/95  soft-tissue]
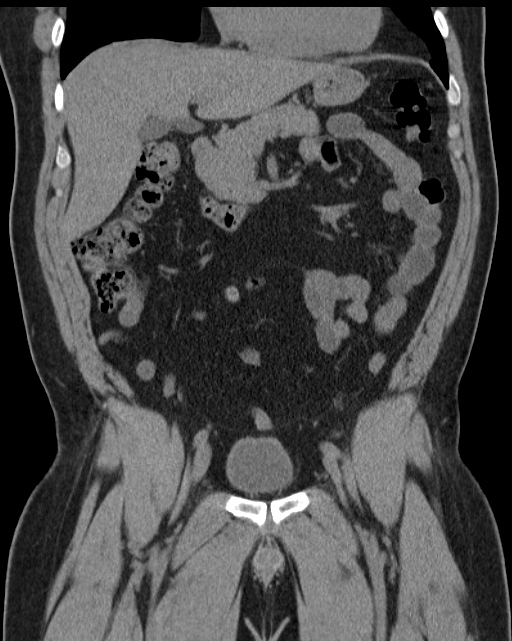
[im 53/95  soft-tissue]
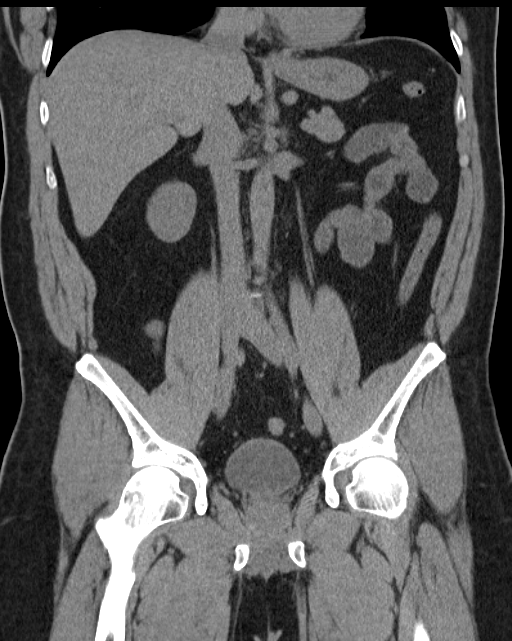

[16 of 46 positions shown; findings below may reference images not displayed]

FINDINGS: Atelectasis in the lung bases.

Kidneys are symmetrical in size and shape. No hydronephrosis or
hydroureter. No renal, ureteral, or bladder stones. 2.6 cm
parapelvic cyst in the left kidney.

Sub cm low-attenuation lesion in the left lobe of the liver and sub
cm low-attenuation lesion in the spleen. These lesions are too small
to accurately characterize and were not definitely present on the
previous study. Statistically, these likely represent cysts or
hemangiomas. The unenhanced appearance of the gallbladder, pancreas,
adrenal glands, abdominal aorta, inferior vena cava, and
retroperitoneal lymph nodes is unremarkable. Scattered celiac axis
and mesenteric lymph nodes are not pathologically enlarged. Stomach,
small bowel, and colon are not abnormally distended. No free air or
free fluid in the abdomen.

Pelvis: Surgical absence of the appendix. Prostate gland is not
enlarged. Bladder wall is not thickened. No free or loculated pelvic
fluid collections. No pelvic mass or lymphadenopathy. No destructive
bone lesions.
IMPRESSION: No renal or ureteral stone or obstruction. Parapelvic cysts in the
left kidney. Sub cm lesions in the liver and spleen are too small to
characterize.

## 2016-05-26 NOTE — Patient Instructions (Signed)
[name]  05/26/2016     @PREFPERIOPPHARMACY @   Your procedure is scheduled on  06/05/2016   Report to [facility] at  615  A.M.  Call this number if you have problems the morning of surgery:  [phone]   Remember:  Do not eat food or drink liquids after midnight.  Take these medicines the morning of surgery with A SIP OF WATER  Xanax, lexapro.   Do not wear jewelry, make-up or nail polish.  Do not wear lotions, powders, or perfumes, or deoderant.  Do not shave 48 hours prior to surgery.  Men may shave face and neck.  Do not bring valuables to the hospital.  [facility] is not responsible for any belongings or valuables.  Contacts, dentures or bridgework may not be worn into surgery.  Leave your suitcase in the car.  After surgery it may be brought to your room.  For patients admitted to the hospital, discharge time will be determined by your treatment team.  Patients discharged the day of surgery will not be allowed to drive home.   Name and phone number of your driver:   family Special instructions:  Follow the diet and prep instructions given to you by Dr Luvenia Starch office.  Please read over the following fact sheets that you were given. Anesthesia Post-op Instructions and Care and Recovery After Surgery       Colonoscopy, Adult A colonoscopy is an exam to look at the entire large intestine. During the exam, a lubricated, bendable tube is inserted into the anus and then passed into the rectum, colon, and other parts of the large intestine. A colonoscopy is often done as a part of normal colorectal screening or in response to certain symptoms, such as anemia, persistent diarrhea, abdominal pain, and blood in the stool. The exam can help screen for and diagnose medical problems, including:  Tumors.  Polyps.  Inflammation.  Areas of bleeding. Tell a health care provider about:  Any allergies you have.  All medicines you are taking, including  vitamins, herbs, eye drops, creams, and over-the-counter medicines.  Any problems you or family members have had with anesthetic medicines.  Any blood disorders you have.  Any surgeries you have had.  Any medical conditions you have.  Any problems you have had passing stool. What are the risks? Generally, this is a safe procedure. However, problems may occur, including:  Bleeding.  A tear in the intestine.  A reaction to medicines given during the exam.  Infection (rare). What happens before the procedure? Eating and drinking restrictions  Follow instructions from your health care provider about eating and drinking, which may include:  A few days before the procedure - follow a low-fiber diet. Avoid nuts, seeds, dried fruit, raw fruits, and vegetables.  1-3 days before the procedure - follow a clear liquid diet. Drink only clear liquids, such as clear broth or bouillon, black coffee or tea, clear juice, clear soft drinks or sports drinks, gelatin desert, and popsicles. Avoid any liquids that contain red or purple dye.  On the day of the procedure - do not eat or drink anything during the 2 hours before the procedure, or within the time period that your health care provider recommends. Bowel prep  If you were prescribed an oral bowel prep to clean out your colon:  Take it as told by your health care provider. Starting the day before your procedure, you will  need to drink a large amount of medicated liquid. The liquid will cause you to have multiple loose stools until your stool is almost clear or light green.  If your skin or anus gets irritated from diarrhea, you may use these to relieve the irritation:  Medicated wipes, such as adult wet wipes with aloe and vitamin E.  A skin soothing-product like petroleum jelly.  If you vomit while drinking the bowel prep, take a break for up to 60 minutes and then begin the bowel prep again. If vomiting continues and you cannot take the  bowel prep without vomiting, call your health care provider. General instructions  Ask your health care provider about changing or stopping your regular medicines. This is especially important if you are taking diabetes medicines or blood thinners.  Plan to have someone take you home from the hospital or clinic. What happens during the procedure?  An IV tube may be inserted into one of your veins.  You will be given medicine to help you relax (sedative).  To reduce your risk of infection:  Your health care team will wash or sanitize their hands.  Your anal area will be washed with soap.  You will be asked to lie on your side with your knees bent.  Your health care provider will lubricate a long, thin, flexible tube. The tube will have a camera and a light on the end.  The tube will be inserted into your anus.  The tube will be gently eased through your rectum and colon.  Air will be delivered into your colon to keep it open. You may feel some pressure or cramping.  The camera will be used to take images during the procedure.  A small tissue sample may be removed from your body to be examined under a microscope (biopsy). If any potential problems are found, the tissue will be sent to a lab for testing.  If small polyps are found, your health care provider may remove them and have them checked for cancer cells.  The tube that was inserted into your anus will be slowly removed. The procedure may vary among health care providers and hospitals. What happens after the procedure?  Your blood pressure, heart rate, breathing rate, and blood oxygen level will be monitored until the medicines you were given have worn off.  Do not drive for 24 hours after the exam.  You may have a small amount of blood in your stool.  You may pass gas and have mild abdominal cramping or bloating due to the air that was used to inflate your colon during the exam.  It is up to you to get the results  of your procedure. Ask your health care provider, or the department performing the procedure, when your results will be ready. This information is not intended to replace advice given to you by your health care provider. Make sure you discuss any questions you have with your health care provider. Document Released: 06/20/2000 Document Revised: 01/11/2016 Document Reviewed: 09/04/2015 Elsevier Interactive Patient Education  2017 [employer]  Colonoscopy, Adult, Care After This sheet gives you information about how to care for yourself after your procedure. Your health care provider may also give you more specific instructions. If you have problems or questions, contact your health care provider. What can I expect after the procedure? After the procedure, it is common to have:  A small amount of blood in your stool for 24 hours after the procedure.  Some gas.  Mild abdominal cramping or bloating. Follow these instructions at home: General instructions  For the first 24 hours after the procedure:  Do not drive or use machinery.  Do not sign important documents.  Do not drink alcohol.  Do your regular daily activities at a slower pace than normal.  Eat soft, easy-to-digest foods.  Rest often.  Take over-the-counter or prescription medicines only as told by your health care provider.  It is up to you to get the results of your procedure. Ask your health care provider, or the department performing the procedure, when your results will be ready. Relieving cramping and bloating  Try walking around when you have cramps or feel bloated.  Apply heat to your abdomen as told by your health care provider. Use a heat source that your health care provider recommends, such as a moist heat pack or a heating pad.  Place a towel between your skin and the heat source.  Leave the heat on for 20-30 minutes.  Remove the heat if your skin turns bright red. This is especially important if you  are unable to feel pain, heat, or cold. You may have a greater risk of getting burned. Eating and drinking  Drink enough fluid to keep your urine clear or pale yellow.  Resume your normal diet as instructed by your health care provider. Avoid heavy or fried foods that are hard to digest.  Avoid drinking alcohol for as long as instructed by your health care provider. Contact a health care provider if:  You have blood in your stool 2-3 days after the procedure. Get help right away if:  You have more than a small spotting of blood in your stool.  You pass large blood clots in your stool.  Your abdomen is swollen.  You have nausea or vomiting.  You have a fever.  You have increasing abdominal pain that is not relieved with medicine. This information is not intended to replace advice given to you by your health care provider. Make sure you discuss any questions you have with your health care provider. Document Released: 02/05/2004 Document Revised: 03/17/2016 Document Reviewed: 09/04/2015 Elsevier Interactive Patient Education  2017 [employer]  Monitored Anesthesia Care Anesthesia is a term that refers to techniques, procedures, and medicines that help a person stay safe and comfortable during a medical procedure. Monitored anesthesia care, or sedation, is one type of anesthesia. Your anesthesia specialist may recommend sedation if you will be having a procedure that does not require you to be unconscious, such as:  Cataract surgery.  A dental procedure.  A biopsy.  A colonoscopy. During the procedure, you may receive a medicine to help you relax (sedative). There are three levels of sedation:  Mild sedation. At this level, you may feel awake and relaxed. You will be able to follow directions.  Moderate sedation. At this level, you will be sleepy. You may not remember the procedure.  Deep sedation. At this level, you will be asleep. You will not remember the procedure. The  more medicine you are given, the deeper your level of sedation will be. Depending on how you respond to the procedure, the anesthesia specialist may change your level of sedation or the type of anesthesia to fit your needs. An anesthesia specialist will monitor you closely during the procedure. Let your health care provider know about:  Any allergies you have.  All medicines you are taking, including vitamins, herbs, eye drops, creams, and over-the-counter medicines.  Any use of steroids (  by mouth or as a cream).  Any problems you or family members have had with sedatives and anesthetic medicines.  Any blood disorders you have.  Any surgeries you have had.  Any medical conditions you have, such as sleep apnea.  Whether you are pregnant or may be pregnant.  Any use of cigarettes, alcohol, or street drugs. What are the risks? Generally, this is a safe procedure. However, problems may occur, including:  Getting too much medicine (oversedation).  Nausea.  Allergic reaction to medicines.  Trouble breathing. If this happens, a breathing tube may be used to help with breathing. It will be removed when you are awake and breathing on your own.  Heart trouble.  Lung trouble. Before the procedure Staying hydrated  Follow instructions from your health care provider about hydration, which may include:  Up to 2 hours before the procedure - you may continue to drink clear liquids, such as water, clear fruit juice, black coffee, and plain tea. Eating and drinking restrictions  Follow instructions from your health care provider about eating and drinking, which may include:  8 hours before the procedure - stop eating heavy meals or foods such as meat, fried foods, or fatty foods.  6 hours before the procedure - stop eating light meals or foods, such as toast or cereal.  6 hours before the procedure - stop drinking milk or drinks that contain milk.  2 hours before the procedure - stop  drinking clear liquids. Medicines  Ask your health care provider about:  Changing or stopping your regular medicines. This is especially important if you are taking diabetes medicines or blood thinners.  Taking medicines such as aspirin and ibuprofen. These medicines can thin your blood. Do not take these medicines before your procedure if your health care provider instructs you not to. Tests and exams  You will have a physical exam.  You may have blood tests done to show:  How well your kidneys and liver are working.  How well your blood can clot.  General instructions  Plan to have someone take you home from the hospital or clinic.  If you will be going home right after the procedure, plan to have someone with you for 24 hours. What happens during the procedure?  Your blood pressure, heart rate, breathing, level of pain and overall condition will be monitored.  An IV tube will be inserted into one of your veins.  Your anesthesia specialist will give you medicines as needed to keep you comfortable during the procedure. This may mean changing the level of sedation.  The procedure will be performed. After the procedure  Your blood pressure, heart rate, breathing rate, and blood oxygen level will be monitored until the medicines you were given have worn off.  Do not drive for 24 hours if you received a sedative.  You may:  Feel sleepy, clumsy, or nauseous.  Feel forgetful about what happened after the procedure.  Have a sore throat if you had a breathing tube during the procedure.  Vomit. This information is not intended to replace advice given to you by your health care provider. Make sure you discuss any questions you have with your health care provider. Document Released: 03/19/2005 Document Revised: 11/30/2015 Document Reviewed: 10/14/2015 Elsevier Interactive Patient Education  2017 [employer] PATIENT INSTRUCTIONS POST-ANESTHESIA  IMMEDIATELY FOLLOWING  SURGERY:  Do not drive or operate machinery for the first twenty four hours after surgery.  Do not make any important decisions for twenty four hours  after surgery or while taking narcotic pain medications or sedatives.  If you develop intractable nausea and vomiting or a severe headache please notify your doctor immediately.  FOLLOW-UP:  Please make an appointment with your surgeon as instructed. You do not need to follow up with anesthesia unless specifically instructed to do so.  WOUND CARE INSTRUCTIONS (if applicable):  Keep a dry clean dressing on the anesthesia/puncture wound site if there is drainage.  Once the wound has quit draining you may leave it open to air.  Generally you should leave the bandage intact for twenty four hours unless there is drainage.  If the epidural site drains for more than 36-48 hours please call the anesthesia department.  QUESTIONS?:  Please feel free to call your physician or the hospital operator if you have any questions, and they will be happy to assist you.

## 2016-05-27 ENCOUNTER — Encounter (HOSPITAL_COMMUNITY)
Admission: RE | Admit: 2016-05-27 | Discharge: 2016-05-27 | Disposition: A | Discharge status: Home / Self Care | Payer: BLUE CROSS/BLUE SHIELD | Source: Ambulatory Visit | Attending: Internal Medicine | Admitting: Internal Medicine

## 2016-05-27 ENCOUNTER — Encounter (HOSPITAL_COMMUNITY): Payer: Self-pay

## 2016-05-27 DIAGNOSIS — Z79899 Other long term (current) drug therapy: Secondary | ICD-10-CM | POA: Diagnosis not present

## 2016-05-27 DIAGNOSIS — Z800 Family history of malignant neoplasm of digestive organs: Secondary | ICD-10-CM | POA: Diagnosis not present

## 2016-05-27 DIAGNOSIS — F419 Anxiety disorder, unspecified: Secondary | ICD-10-CM | POA: Diagnosis not present

## 2016-05-27 DIAGNOSIS — Z8701 Personal history of pneumonia (recurrent): Secondary | ICD-10-CM | POA: Insufficient documentation

## 2016-05-27 DIAGNOSIS — Z01810 Encounter for preprocedural cardiovascular examination: Secondary | ICD-10-CM | POA: Diagnosis not present

## 2016-05-27 DIAGNOSIS — Z01812 Encounter for preprocedural laboratory examination: Secondary | ICD-10-CM | POA: Diagnosis not present

## 2016-05-27 DIAGNOSIS — Z9889 Other specified postprocedural states: Secondary | ICD-10-CM | POA: Insufficient documentation

## 2016-05-27 DIAGNOSIS — E785 Hyperlipidemia, unspecified: Secondary | ICD-10-CM | POA: Diagnosis not present

## 2016-05-27 LAB — SURGICAL PCR SCREEN
MRSA, PCR: NEGATIVE
Staphylococcus aureus: POSITIVE — AB

## 2016-05-27 LAB — CBC WITH DIFFERENTIAL/PLATELET
BASOS PCT: 1 %
Basophils Absolute: 0.1 10*3/uL (ref 0.0–0.1)
Eosinophils Absolute: 0.5 10*3/uL (ref 0.0–0.7)
Eosinophils Relative: 6 %
HEMATOCRIT: 40 % (ref 39.0–52.0)
HEMOGLOBIN: 13.9 g/dL (ref 13.0–17.0)
LYMPHS ABS: 2.9 10*3/uL (ref 0.7–4.0)
Lymphocytes Relative: 34 %
MCH: 30.4 pg (ref 26.0–34.0)
MCHC: 34.8 g/dL (ref 30.0–36.0)
MCV: 87.5 fL (ref 78.0–100.0)
Monocytes Absolute: 0.6 10*3/uL (ref 0.1–1.0)
Monocytes Relative: 7 %
NEUTROS ABS: 4.6 10*3/uL (ref 1.7–7.7)
NEUTROS PCT: 52 %
Platelets: 280 10*3/uL (ref 150–400)
RBC: 4.57 MIL/uL (ref 4.22–5.81)
RDW: 12.4 % (ref 11.5–15.5)
WBC: 8.6 10*3/uL (ref 4.0–10.5)

## 2016-05-27 LAB — BASIC METABOLIC PANEL
Anion gap: 5 (ref 5–15)
BUN: 10 mg/dL (ref 6–20)
CHLORIDE: 103 mmol/L (ref 101–111)
CO2: 28 mmol/L (ref 22–32)
CREATININE: 0.93 mg/dL (ref 0.61–1.24)
Calcium: 8.9 mg/dL (ref 8.9–10.3)
GFR calc non Af Amer: >60 mL/min (ref >60)
Glucose, Bld: 119 mg/dL — ABNORMAL HIGH (ref 65–99)
POTASSIUM: 3.5 mmol/L (ref 3.5–5.1)
Sodium: 136 mmol/L (ref 135–145)

## 2016-06-05 ENCOUNTER — Encounter (HOSPITAL_COMMUNITY)
Admission: RE | Disposition: A | Discharge status: Home / Self Care | Payer: Self-pay | Source: Ambulatory Visit | Attending: Internal Medicine

## 2016-06-05 ENCOUNTER — Ambulatory Visit (HOSPITAL_COMMUNITY)
Admission: RE | Admit: 2016-06-05 | Discharge: 2016-06-05 | Disposition: A | Discharge status: Home / Self Care | Payer: BLUE CROSS/BLUE SHIELD | Source: Ambulatory Visit | Attending: Internal Medicine | Admitting: Internal Medicine

## 2016-06-05 ENCOUNTER — Ambulatory Visit (HOSPITAL_COMMUNITY): Payer: BLUE CROSS/BLUE SHIELD | Admitting: Anesthesiology

## 2016-06-05 ENCOUNTER — Encounter (HOSPITAL_COMMUNITY): Payer: Self-pay | Admitting: *Deleted

## 2016-06-05 DIAGNOSIS — Z1211 Encounter for screening for malignant neoplasm of colon: Secondary | ICD-10-CM | POA: Diagnosis not present

## 2016-06-05 DIAGNOSIS — E785 Hyperlipidemia, unspecified: Secondary | ICD-10-CM | POA: Diagnosis not present

## 2016-06-05 DIAGNOSIS — Z800 Family history of malignant neoplasm of digestive organs: Secondary | ICD-10-CM | POA: Diagnosis not present

## 2016-06-05 DIAGNOSIS — K219 Gastro-esophageal reflux disease without esophagitis: Secondary | ICD-10-CM | POA: Insufficient documentation

## 2016-06-05 DIAGNOSIS — Z1212 Encounter for screening for malignant neoplasm of rectum: Secondary | ICD-10-CM | POA: Diagnosis not present

## 2016-06-05 DIAGNOSIS — F419 Anxiety disorder, unspecified: Secondary | ICD-10-CM | POA: Insufficient documentation

## 2016-06-05 DIAGNOSIS — Z79899 Other long term (current) drug therapy: Secondary | ICD-10-CM | POA: Diagnosis not present

## 2016-06-05 HISTORY — PX: COLONOSCOPY WITH PROPOFOL: SHX5780

## 2016-06-05 SURGERY — COLONOSCOPY WITH PROPOFOL
Anesthesia: Monitor Anesthesia Care

## 2016-06-05 MED ORDER — ONDANSETRON HCL 4 MG/2ML IJ SOLN
4.0000 mg | Freq: Once | INTRAMUSCULAR | Status: AC
  Administered 2016-06-05: 4 mg via INTRAVENOUS

## 2016-06-05 MED ORDER — ONDANSETRON HCL 4 MG/2ML IJ SOLN
INTRAMUSCULAR | Status: AC
  Filled 2016-06-05: qty 2

## 2016-06-05 MED ORDER — PROPOFOL 500 MG/50ML IV EMUL
INTRAVENOUS | Status: DC | PRN
  Administered 2016-06-05: 100 ug/kg/min via INTRAVENOUS

## 2016-06-05 MED ORDER — FENTANYL CITRATE (PF) 100 MCG/2ML IJ SOLN
INTRAMUSCULAR | Status: AC
  Filled 2016-06-05: qty 2

## 2016-06-05 MED ORDER — LACTATED RINGERS IV SOLN
INTRAVENOUS | Status: DC
  Administered 2016-06-05: 1000 mL via INTRAVENOUS

## 2016-06-05 MED ORDER — FENTANYL CITRATE (PF) 100 MCG/2ML IJ SOLN
25.0000 ug | INTRAMUSCULAR | Status: AC | PRN
  Administered 2016-06-05 (×2): 25 ug via INTRAVENOUS

## 2016-06-05 MED ORDER — MIDAZOLAM HCL 2 MG/2ML IJ SOLN
INTRAMUSCULAR | Status: AC
  Filled 2016-06-05: qty 2

## 2016-06-05 MED ORDER — PROPOFOL 10 MG/ML IV BOLUS
INTRAVENOUS | Status: AC
  Filled 2016-06-05: qty 40

## 2016-06-05 MED ORDER — MIDAZOLAM HCL 2 MG/2ML IJ SOLN
1.0000 mg | INTRAMUSCULAR | Status: DC | PRN
  Administered 2016-06-05: 2 mg via INTRAVENOUS

## 2016-06-05 NOTE — H&P (View-Only) (Signed)
Primary Care Physician:  Zack Hall, MD Primary Gastroenterologist:  Dr. Jena GausDwana Melenasourk  Chief Complaint  Patient presents with  . Colonoscopy    never had one, no problems    HPI:   [name] is a [age] y.o. male who presents to schedule first-ever colonoscopy. The patient was initially phone triage but that was deferred to office visit due to medications and alcohol use which will likely require propofol.  Today he states he's doing well overall. Family history of colon ca in Father. Has never had a colonoscopy. Denies abdominal pain, N/V, fever, chills, sudden changes in bowel habits, hematochezia, melena, unintentional weight loss. Denies chest pain, dyspnea, dizziness, lightheadedness, syncope, near syncope. Denies any other upper or lower GI symptoms.  Past Medical History:  Diagnosis Date  . Anxiety    "mostly to unwind and go to sleep, works third shift"  . Hyperlipidemia    controlled with diet  . Pneumonia    hx of    Past Surgical History:  Procedure Laterality Date  . ANTERIOR CERVICAL DECOMP/DISCECTOMY FUSION  05/21/2012   Procedure: ANTERIOR CERVICAL DECOMPRESSION/DISCECTOMY FUSION 2 LEVELS;  Surgeon: Hewitt Shortsobert W Nudelman, MD;  Location: [facility];  Service: Neurosurgery;  Laterality: Bilateral;  Cervical five-six, six-seven Anterior cervical decompression/diskectomy/fusion  . APPENDECTOMY    . HERNIA REPAIR     umbilical    Current Outpatient Prescriptions  Medication Sig Dispense Refill  . ALPRAZolam (XANAX) 0.5 MG tablet Take 0.25 mg by mouth daily. Pt said he takes one with his Lexapro to sleep since he works 3rd shift     . escitalopram (LEXAPRO) 10 MG tablet Take 10 mg by mouth at bedtime.     No current facility-administered medications for this visit.     Allergies as of 05/12/2016  . (No Known Allergies)    Family History  Problem Relation Age of Onset  . Colon cancer Father [id number]    Surgical treatment, remission, recurrence with METS to Liver and has  passed away age 50    Social History   Social History  . Marital status: Married    Spouse name: N/A  . Number of children: N/A  . Years of education: N/A   Occupational History  . Not on file.   Social History Main Topics  . Smoking status: Never Smoker  . Smokeless tobacco: Never Used  . Alcohol use Yes     Comment: Beer 1-2 times a week  . Drug use: No  . Sexual activity: Not on file   Other Topics Concern  . Not on file   Social History Narrative  . No narrative on file    Review of Systems: General: Negative for anorexia, weight loss, fever, chills, fatigue, weakness. ENT: Negative for hoarseness, difficulty swallowing , nasal congestion. CV: Negative for chest pain, angina, palpitations, dyspnea on exertion, peripheral edema.  Respiratory: Negative for dyspnea at rest, cough, sputum, wheezing.  GI: See history of present illness. MS: Negative for joint pain, low back pain.  Derm: Negative for rash or itching.  Endo: Negative for unusual weight change.  Heme: Negative for bruising or bleeding. Allergy: Negative for rash or hives.   Physical Exam: BP 128/78   Pulse 66   Temp 97.7 F (36.5 C) (Oral)   Ht 5\' 8"  (1.727 m)   Wt 207 lb (93.9 kg)   BMI 31.47 kg/m  General:   Alert and oriented. Pleasant and cooperative. Well-nourished and well-developed.  Head:  Normocephalic and  atraumatic. Eyes:  Without icterus, sclera clear and conjunctiva pink.  Ears:  Normal auditory acuity. Cardiovascular:  S1, S2 present without murmurs appreciated. Extremities without clubbing or edema. Respiratory:  Clear to auscultation bilaterally. No wheezes, rales, or rhonchi. No distress.  Gastrointestinal:  +BS, soft, non-tender and non-distended. No HSM noted. No guarding or rebound. No masses appreciated.  Rectal:  Deferred  Musculoskalatal:  Symmetrical without gross deformities. Neurologic:  Alert and oriented x4;  grossly normal neurologically. Psych:  Alert and  cooperative. Normal mood and affect. Heme/Lymph/Immune: No excessive bruising noted.    05/12/2016 8:45 AM   Disclaimer: This note was dictated with voice recognition software. Similar sounding words can inadvertently be transcribed and may not be corrected upon review.

## 2016-06-05 NOTE — Anesthesia Preprocedure Evaluation (Signed)
Anesthesia Evaluation  Patient identified by MRN, date of birth, ID band Patient awake    Reviewed: Allergy & Precautions, NPO status , Patient's Chart, lab work & pertinent test results  Airway Mallampati: III  TM Distance: >3 FB     Dental  (+) Teeth Intact   Pulmonary neg pulmonary ROS,    breath sounds clear to auscultation       Cardiovascular negative cardio ROS   Rhythm:Regular Rate:Normal     Neuro/Psych PSYCHIATRIC DISORDERS Anxiety    GI/Hepatic GERD  Controlled,  Endo/Other    Renal/GU      Musculoskeletal   Abdominal   Peds  Hematology   Anesthesia Other Findings   Reproductive/Obstetrics                             Anesthesia Physical Anesthesia Plan  ASA: II  Anesthesia Plan: MAC   Post-op Pain Management:    Induction: Intravenous  Airway Management Planned: Simple Face Mask  Additional Equipment:   Intra-op Plan:   Post-operative Plan:   Informed Consent: I have reviewed the patients History and Physical, chart, labs and discussed the procedure including the risks, benefits and alternatives for the proposed anesthesia with the patient or authorized representative who has indicated his/her understanding and acceptance.     Plan Discussed with:   Anesthesia Plan Comments:         Anesthesia Quick Evaluation

## 2016-06-05 NOTE — Interval H&P Note (Signed)
History and Physical Interval Note:  06/05/2016 7:21 AM  [name]  has presented today for surgery, with the diagnosis of FAMILY HISTORY  The various methods of treatment have been discussed with the patient and family. After consideration of risks, benefits and other options for treatment, the patient has consented to  Procedure(s) with comments: COLONOSCOPY WITH PROPOFOL (N/A) - 730  as a surgical intervention .  The patient's history has been reviewed, patient examined, no change in status, stable for surgery.  I have reviewed the patient's chart and labs.  Questions were answered to the patient's satisfaction.     [name]  No change; tcs per plan.  .The risks, benefits, limitations, alternatives and imponderables have been reviewed with the patient. Questions have been answered. All parties are agreeable.

## 2016-06-05 NOTE — Anesthesia Postprocedure Evaluation (Signed)
Anesthesia Post Note  Patient: [name]  Procedure(s) Performed: Procedure(s) (LRB): COLONOSCOPY WITH PROPOFOL (N/A)  Patient location during evaluation: PACU Anesthesia Type: MAC Level of consciousness: awake and alert and oriented Pain management: pain level controlled Vital Signs Assessment: post-procedure vital signs reviewed and stable Respiratory status: spontaneous breathing Cardiovascular status: blood pressure returned to baseline Postop Assessment: adequate PO intake Anesthetic complications: no    Last Vitals:  Vitals:   06/05/16 0715 06/05/16 0804  BP:    Pulse:    Resp: 13   Temp:  (P) 36.4 C    Last Pain:  Vitals:   06/05/16 0628  TempSrc: Oral                 Herbie Lehrmann

## 2016-06-05 NOTE — Transfer of Care (Signed)
Immediate Anesthesia Transfer of Care Note  Patient: [name]  Procedure(s) Performed: Procedure(s) with comments: COLONOSCOPY WITH PROPOFOL (N/A) - 730   Patient Location: PACU  Anesthesia Type:MAC  Level of Consciousness: awake and alert   Airway & Oxygen Therapy: Patient Spontanous Breathing  Post-op Assessment: Report given to RN  Post vital signs: Reviewed  Last Vitals:  Vitals:   06/05/16 0715 06/05/16 0804  BP:    Pulse:    Resp: 13   Temp:  (P) 36.4 C    Last Pain:  Vitals:   06/05/16 0628  TempSrc: Oral         Complications: No apparent anesthesia complications

## 2016-06-05 NOTE — Op Note (Signed)
[facility] Patient Name: [name] Procedure Date: 06/05/2016 7:35 AM MRN: [id number] Date of Birth: 1965/12/26 Attending MD: Gennette Pacobert Michael Rourk , MD CSN: [id number] Age: [age] Admit Type: Outpatient Procedure:                Colonoscopy Indications:              Screening in patient at increased risk: Family                            history of 1st-degree relative with colorectal                            cancer Providers:                Gennette Pacobert Michael Rourk, MD, Jonathan MessingLurae B. Patsy LagerAlbert RN, RN,                            Burke Keelsrisann Tilley, Technician Referring MD:              Medicines:                Propofol per Anesthesia Complications:            No immediate complications. Estimated Blood Loss:     Estimated blood loss: none. Procedure:                Pre-Anesthesia Assessment:                           - Prior to the procedure, a History and Physical                            was performed, and patient medications and                            allergies were reviewed. The patient's tolerance of                            previous anesthesia was also reviewed. The risks                            and benefits of the procedure and the sedation                            options and risks were discussed with the patient.                            All questions were answered, and informed consent                            was obtained. Prior Anticoagulants: The patient has                            taken no previous anticoagulant or antiplatelet                            agents. ASA  Grade Assessment: II - A patient with                            mild systemic disease. After reviewing the risks                            and benefits, the patient was deemed in                            satisfactory condition to undergo the procedure.                           After obtaining informed consent, the colonoscope                            was passed under direct vision.  Throughout the                            procedure, the patient's blood pressure, pulse, and                            oxygen saturations were monitored continuously. The                            EC-[id number] ([id number] scope was introduced through                            the and advanced to the the cecum, identified by                            appendiceal orifice and ileocecal valve. The                            ileocecal valve, appendiceal orifice, and rectum                            were photographed. The entire colon was well                            visualized. The colonoscopy was performed without                            difficulty. The patient tolerated the procedure                            well. The quality of the bowel preparation was                            adequate. Scope In: 7:41:19 AM Scope Out: 7:57:18 AM Scope Withdrawal Time: 0 hours 10 minutes 44 seconds  Total Procedure Duration: 0 hours 15 minutes 59 seconds  Findings:      The perianal and digital rectal examinations were normal.      The colon (entire examined portion) appeared normal. Estimated blood  loss: none. Impression:               - The entire examined colon is normal.                           - No specimens collected. Moderate Sedation:      Moderate (conscious) sedation was personally administered by an       anesthesia professional. The following parameters were monitored: oxygen       saturation, heart rate, blood pressure, respiratory rate, EKG, adequacy       of pulmonary ventilation, and response to care. Total physician       intraservice time was 24 minutes. Recommendation:           - Patient has a contact number available for                            emergencies. The signs and symptoms of potential                            delayed complications were discussed with the                            patient. Return to normal activities tomorrow.                             Written discharge instructions were provided to the                            patient.                           - Resume previous diet.                           - Continue present medications.                           - Repeat colonoscopy in 5 years for screening                            purposes.                           - Return to GI office PRN. Procedure Code(s):        --- Professional ---                           [phone], Colonoscopy, flexible; diagnostic, including                            collection of specimen(s) by brushing or washing,                            when performed (separate procedure) Diagnosis Code(s):        --- Professional ---  Z80.0, Family history of malignant neoplasm of                            digestive organs CPT copyright 2016 American Medical Association. All rights reserved. The codes documented in this report are preliminary and upon coder review may  be revised to meet current compliance requirements. Gerrit Friends. Rourk, MD Gennette Pac, MD 06/05/2016 8:08:15 AM This report has been signed electronically. Number of Addenda: 0

## 2016-06-05 NOTE — Discharge Instructions (Addendum)

## 2016-06-06 ENCOUNTER — Encounter (HOSPITAL_COMMUNITY): Payer: Self-pay | Admitting: Internal Medicine

## 2016-07-30 DIAGNOSIS — D239 Other benign neoplasm of skin, unspecified: Secondary | ICD-10-CM | POA: Diagnosis not present

## 2016-07-30 DIAGNOSIS — Z6832 Body mass index (BMI) 32.0-32.9, adult: Secondary | ICD-10-CM | POA: Diagnosis not present

## 2016-07-30 DIAGNOSIS — F339 Major depressive disorder, recurrent, unspecified: Secondary | ICD-10-CM | POA: Diagnosis not present

## 2016-10-31 DIAGNOSIS — R05 Cough: Secondary | ICD-10-CM | POA: Diagnosis not present

## 2016-10-31 DIAGNOSIS — J060 Acute laryngopharyngitis: Secondary | ICD-10-CM | POA: Diagnosis not present

## 2016-10-31 DIAGNOSIS — Z6833 Body mass index (BMI) 33.0-33.9, adult: Secondary | ICD-10-CM | POA: Diagnosis not present

## 2017-01-23 DIAGNOSIS — Z0000 Encounter for general adult medical examination without abnormal findings: Secondary | ICD-10-CM | POA: Diagnosis not present

## 2017-01-28 DIAGNOSIS — F339 Major depressive disorder, recurrent, unspecified: Secondary | ICD-10-CM | POA: Diagnosis not present

## 2017-01-28 DIAGNOSIS — Z0000 Encounter for general adult medical examination without abnormal findings: Secondary | ICD-10-CM | POA: Diagnosis not present

## 2017-01-28 DIAGNOSIS — K219 Gastro-esophageal reflux disease without esophagitis: Secondary | ICD-10-CM | POA: Diagnosis not present

## 2017-01-28 DIAGNOSIS — Z1283 Encounter for screening for malignant neoplasm of skin: Secondary | ICD-10-CM | POA: Diagnosis not present

## 2017-06-11 DIAGNOSIS — J019 Acute sinusitis, unspecified: Secondary | ICD-10-CM | POA: Diagnosis not present

## 2017-06-11 DIAGNOSIS — R070 Pain in throat: Secondary | ICD-10-CM | POA: Diagnosis not present

## 2017-08-21 DIAGNOSIS — M62838 Other muscle spasm: Secondary | ICD-10-CM | POA: Diagnosis not present

## 2017-08-25 ENCOUNTER — Telehealth: Payer: Self-pay | Admitting: Orthopaedic Surgery

## 2017-08-25 NOTE — Telephone Encounter (Signed)
Patient(spouse/designated contact, Okey Regalarol) called to inquire about scheduling appointment with Dr Hilda LiasKeeling for hand tremors.* *Spoke with Dr Hilda LiasKeeling, relays that our clinic does not treat; recommend neurologist.  States understands, appreciates the information, and that patient's primary care, Dr Margo AyeHall, may be working on a referral.

## 2017-09-03 ENCOUNTER — Encounter: Payer: Self-pay | Admitting: Neurology

## 2017-09-03 ENCOUNTER — Ambulatory Visit: Payer: BLUE CROSS/BLUE SHIELD | Admitting: Neurology

## 2017-09-03 VITALS — BP 138/90 | HR 64 | Ht 68.0 in | Wt 216.5 lb

## 2017-09-03 DIAGNOSIS — R251 Tremor, unspecified: Secondary | ICD-10-CM

## 2017-09-03 NOTE — Progress Notes (Signed)
Subjective:    Patient ID: [name] is a 52 y.o. male.  HPI     Jonathan Age, MD, PhD [facility] [address] [place], [place] [address]  Dear Dr. [place] Cantu,   I saw your patient, [name], upon your kind request in my neurologic clinic today for initial consultation of his left hand tremor. Patient is accompanied by his wife today. As you know, [name] is a 52 year old right-handed gentleman with an underlying medical history cervical disc disease with status post neck surgery, history of anxiety, hyperlipidemia, and obesity, who reports approximately  month history of left hand tremor. He notices a tremor in the left hand from the elbow down, usually at rest or with standing or walking. He has not noticed any other fine motor difficulties, no balance issues, no other tremor, no problem standing or walking, no difficulty performing his job. His wife has noticed more mood irritability. Of note, he has been on Lexapro for the past 2 years and has not noticed any tremor before. He has no family history of essential tremor or Parkinson's disease. He has been on Xanax 0.5 mg at night for the past 2 years or so, this started when he was on third shift and help him achieve sleep a little better. He has been on first shift now. He works as a Dealer. He had routine blood work through your office recently within the past 6 months. He was given a prescription recently for propranolol but decided not to fill it. He is not sure when his thyroid function was tested last. He had neck surgery under Dr. Sherwood Cantu in December 2013 with good outcome. His last neck MRI was in November 2013. He is married and lives with his wife and his son. He is a nonsmoker, drinks alcohol occasionally, drinks caffeine in the form of coffee, 2 cups per day on average.   His Past Medical History Is Significant For: Past Medical History:  Diagnosis Date  . Anxiety    "mostly to unwind and  go to sleep, works third shift"  . Bronchitis   . Hyperlipidemia    controlled with diet  . Pneumonia    hx of  . Tremor     His Past Surgical History Is Significant For: Past Surgical History:  Procedure Laterality Date  . ANTERIOR CERVICAL DECOMP/DISCECTOMY FUSION  05/21/2012   Procedure: ANTERIOR CERVICAL DECOMPRESSION/DISCECTOMY FUSION 2 LEVELS;  Surgeon: Jonathan Spangle, MD;  Location: [facility];  Service: Neurosurgery;  Laterality: Bilateral;  Cervical five-six, six-seven Anterior cervical decompression/diskectomy/fusion  . APPENDECTOMY    . COLONOSCOPY WITH PROPOFOL N/A 06/05/2016   Procedure: COLONOSCOPY WITH PROPOFOL;  Surgeon: Daneil Dolin, MD;  Location: [facility];  Service: Endoscopy;  Laterality: N/A;  730   . HERNIA REPAIR     umbilical    His Family History Is Significant For: Family History  Problem Relation Cantu of Onset  . Colon cancer Father 22       Surgical treatment, remission, recurrence with METS to Liver and has passed away Cantu 67    His Social History Is Significant For: Social History   Socioeconomic History  . Marital status: Married    Spouse name: None  . Number of children: None  . Years of education: None  . Highest education level: None  Social Needs  . Financial resource strain: None  . Food insecurity - worry: None  . Food insecurity -  inability: None  . Transportation needs - medical: None  . Transportation needs - non-medical: None  Occupational History  . None  Tobacco Use  . Smoking status: Never Smoker  . Smokeless tobacco: Never Used  Substance and Sexual Activity  . Alcohol use: Yes    Comment: Beer 1-2 times a week  . Drug use: No  . Sexual activity: None  Other Topics Concern  . None  Social History Narrative  . None    His Allergies Are:  No Known Allergies:   His Current Medications Are:  Outpatient Encounter Medications as of 09/03/2017  Medication Sig  . ALPRAZolam (XANAX) 0.5 MG tablet Take  0.25 mg by mouth at bedtime.   Marland Kitchen escitalopram (LEXAPRO) 10 MG tablet Take 10 mg by mouth at bedtime.  Marland Kitchen ibuprofen (ADVIL,MOTRIN) 200 MG tablet Take 600 mg by mouth every 8 (eight) hours as needed (for pain).  . [DISCONTINUED] peg 3350 powder (MOVIPREP) 100 g SOLR Take 1 kit (200 g total) by mouth as directed.  . [DISCONTINUED] propranolol (INDERAL) 40 MG tablet Take 40 mg by mouth 2 (two) times daily.   No facility-administered encounter medications on file as of 09/03/2017.   : Review of Systems:  Out of a complete 14 point review of systems, all are reviewed and negative with the exception of these symptoms as listed below:  Review of Systems  Neurological:       Patient has been having a tremor in his left elbow down to his hand for about the last 3 months and it has gotten progressively worse. Patient is right-handed.     Objective:  Neurological Exam  Physical Exam Physical Examination:   Vitals:   09/03/17 0918  BP: 138/90  Pulse: 64    General Examination: The patient is a very pleasant 52 y.o. male in no acute distress. He appears well-developed and well-nourished and well groomed.   HEENT: Normocephalic, atraumatic, pupils are equal, round and reactive to light and accommodation. Extraocular tracking is good without limitation to gaze excursion or nystagmus noted. Normal smooth pursuit is noted. Hearing is grossly intact. Face is symmetric with normal facial animation and normal facial sensation. Speech is clear with no dysarthria noted. There is no hypophonia. There is no lip, neck/head, jaw or voice tremor. Neck with mild decrease in range of motion, and conspicuous anterior left scar from neck surgery.   Chest: No obvious abnormality noted.   Heart: No obvious abnormality noted.    Abdomen: no obvious abnormalities noted.  Extremities: There is no edema noted.   Skin: Warm and dry without trophic changes noted.   Musculoskeletal: exam reveals no obvious joint  deformities, tenderness or joint swelling or erythema.   Neurologically:  Mental status: The patient is awake, alert and oriented in all 4 spheres. His immediate and remote memory, attention, language skills and fund of knowledge are appropriate. There is no evidence of aphasia, agnosia, apraxia or anomia. Speech is clear with normal prosody and enunciation. Thought process is linear. Mood is normal and affect is normal.  Cranial nerves II - XII are as described above under HEENT exam. In addition: Left shoulder slightly lower than right.   On 09/03/2017: On Archimedes spiral drawing he has no significant difficulty with right or left hand. Handwriting with the right hand is legible, not tremulous, not micrographic.  Motor exam: Normal bulk, strength and tone is noted, with the exception of slight increase in tone in the left upper extremity with right  hand activation. He has an intermittent slight/very mild resting tremor in the left hand only. He has no other tremor. He has no significant postural or action tremor. On fine motor testing he has no significant difficulty with right or left with finger taps, hand movements, rapid alternating patting, foot taps are foot agility. He stands without difficulty, posture is Cantu-appropriate to perhaps slightly stooped for Cantu. He walks with a normal stride length and normal pace, mild decrease in arm swing on the left noted and noticeable tremor in the left hand with walking. Romberg is negative. Balance is preserved.   Cerebellar testing: No dysmetria or intention tremor.   Sensory exam: intact to light touch.               Assessment and plan:   In summary, [name] is a very pleasant 52 y.o.-year old male with an underlying medical history cervical disc disease with status post neck surgery, history of anxiety, hyperlipidemia, and obesity, who presents for consultation for a left hand tremor for the past 5 months with some progression noted. On  examination, he has a very mild intermittent resting tremor in the left hand, perhaps slight increase in tone noted with contralateral activation of the right hand. He has a benign exam otherwise. He has a mild decrease in arm swing on the left. He is advised that his tremor is rather mild, findings not telltale for any definitive diagnosis. Nevertheless, we should monitor his exam and his symptoms. I would also like to proceed with a brain MRI with and without contrast to rule out any structural cause or prior stroke as a potential cause of his tremor. He will find out from Dr. Donnella Bi office if he can have an MRI. He would like to have this done at [facility] if possible, we should be able to do this. We will call him with his MRI results. Further down the road, we can certainly consider a DaT scan probably have to come off of his Lexapro for that scan could be reliably done. At this juncture, we will monitor his symptoms and his clinical findings. Parkinsonism or left-sided predominant Parkinson's disease is certainly within the considerations. I encouraged patient to stay active mentally and physically, exercise on a regular basis, keep well hydrated with water and well rested. I will see him back routinely in 6 months, we should be in touch in the interim regarding his MRI results. I answered all her questions today and the patient and his wife were in agreement. Thank you very much for allowing me to participate in the care of this nice patient. If I can be of any further assistance to you please do not hesitate to call me at [phone].  Sincerely,   Jonathan Age, MD, PhD

## 2017-09-03 NOTE — Patient Instructions (Signed)
You have a mild left hand tremor, we will monitor this. Your general neurological exam is rather benign and tremor is in the very mild range at this time. Therefore, I do not suggest any medication at this time from my end of things. I do want you to continue to take good care of yourself including exercise regularly, stay well-hydrated with water, keep a regular sleep-wake schedule, stress reduction etc.   We will continue to monitor and will hold off on medication trials for now.   As far as your medications are concerned, I would like to suggest, that we re-evaluate you in 6 months.   As far as diagnostic testing, I will order: a brain MRI with and without contrast, please check with Dr. Earl GalaNudelman's office if you can actually have an MRI since her neck surgery. We may consider, down the road a special scan, called DaT scan.  I would like to see you back in 6 months, sooner if we need to. Please call us with any interim questions, concerns, problems, updates or refill requests.  Our phone number is [phone]. We also have an after hours call service for urgent matters and there is a physician on-call for urgent questions, that cannot wait till the next work day. For any emergencies you know to call 911 or go to the nearest emergency room.   You can email me through my chart and also leave a phone message for Baxter HireKristen, my nurse.

## 2017-09-07 ENCOUNTER — Telehealth: Payer: Self-pay | Admitting: Neurology

## 2017-09-07 NOTE — Telephone Encounter (Addendum)
Reviewed cervical surgery notes from 05/21/2012. Per Dr. Earl GalaNudelman's op note, "We then selected a 35 millimeter in height Tether cervical plate."  I believe pt should call Dr. Vonzell SchlatterNudelmna's office and ask if he is able to have an MRI, due to the concern that his neck has some type of metal from his surgery. We are a part of [facility] and can see records, so I don't know what asking for a MR release will accomplish in this situation.

## 2017-09-07 NOTE — Telephone Encounter (Signed)
Pt needs to call Dr. Earl GalaNudelman's office to ask if he can have brain MRI given the plate in the neck. If okay, he can call us back with that information.

## 2017-09-07 NOTE — Telephone Encounter (Signed)
Spoke to patient wife Okey RegalCarol and informed her of his MRI appointment is Thursday 09/10/17 arrival time is 1:45 pm at [facility]. She is aware of time & day.

## 2017-09-07 NOTE — Telephone Encounter (Signed)
Pts wife called stating Dr. Frances FurbishAthar needed to know if pt had metal or titanium place in his neck during his surgery back in 2013 with Dr. Newell CoralNudelman. Carol(pts wife) said she had "called all around to get this information" but has been told "[facility]". Also told to put "STAT on the request so medical records can send it over faster" Fax # [phone]

## 2017-09-09 DIAGNOSIS — J060 Acute laryngopharyngitis: Secondary | ICD-10-CM | POA: Diagnosis not present

## 2017-09-09 DIAGNOSIS — R251 Tremor, unspecified: Secondary | ICD-10-CM | POA: Diagnosis not present

## 2017-09-09 DIAGNOSIS — Z6833 Body mass index (BMI) 33.0-33.9, adult: Secondary | ICD-10-CM | POA: Diagnosis not present

## 2017-09-10 ENCOUNTER — Ambulatory Visit (HOSPITAL_COMMUNITY)
Admission: RE | Admit: 2017-09-10 | Discharge: 2017-09-10 | Disposition: A | Discharge status: Home / Self Care | Payer: BLUE CROSS/BLUE SHIELD | Source: Ambulatory Visit | Attending: Neurology | Admitting: Neurology

## 2017-09-10 DIAGNOSIS — J328 Other chronic sinusitis: Secondary | ICD-10-CM | POA: Insufficient documentation

## 2017-09-10 DIAGNOSIS — R251 Tremor, unspecified: Secondary | ICD-10-CM | POA: Insufficient documentation

## 2017-09-10 DIAGNOSIS — G20 Parkinson's disease: Secondary | ICD-10-CM | POA: Diagnosis not present

## 2017-09-10 MED ORDER — GADOBENATE DIMEGLUMINE 529 MG/ML IV SOLN
20.0000 mL | Freq: Once | INTRAVENOUS | Status: AC | PRN
  Administered 2017-09-10: 20 mL via INTRAVENOUS

## 2017-09-14 ENCOUNTER — Telehealth: Payer: Self-pay | Admitting: *Deleted

## 2017-09-14 NOTE — Telephone Encounter (Signed)
Spoke with pt. and reviewed below MRI report, and advised the physician will discuss in greater detail, along with tx. options, at f/u appt. He verbalized understanding of same/fim

## 2017-09-14 NOTE — Telephone Encounter (Signed)
Dr. Frances FurbishAthar is out of the office. I have sent a request to [facility] Ep[facility]ater[facility], to review the MRI on Dr. Teofilo PodAthar's behalf.

## 2017-09-14 NOTE — Telephone Encounter (Signed)
Patient calling to get MRI results. I advised Dr. Frances FurbishAthar is out of the office and will send to her nurse.

## 2017-09-14 NOTE — Telephone Encounter (Signed)
-----   Message from Asa Lenteichard A Sater, MD sent at 09/14/2017  3:09 PM EDT ----- Please let him know that the MRI of the brain is normal for age.

## 2017-09-15 ENCOUNTER — Telehealth: Payer: Self-pay | Admitting: Neurology

## 2017-09-15 NOTE — Telephone Encounter (Signed)
See other telephone note from yesterday with MRI results.

## 2017-09-15 NOTE — Telephone Encounter (Signed)
I called pt, offered him a sooner appt with Eber Jonesarolyn, NP. Pt accepted an appt on 3/20 at 8:15am. He reports that his "shaking" has increased. Pt verbalized understanding of new appt date and time.

## 2017-09-15 NOTE — Telephone Encounter (Signed)
If he has increase in tremor on exam, one consideration could be a trial of Sinemet. We will await visit with CM. Nothing further needed.

## 2017-09-15 NOTE — Telephone Encounter (Signed)
Patient's wife calling advising since last appointment with Dr. Frances FurbishAthar on 09-03-17 patient's tremors have gotten worse. Please call to discuss.

## 2017-09-21 NOTE — Progress Notes (Addendum)
/ [facility]  PATIENT: [name] DOB: 1965/08/30   REASON FOR VISIT: Follow-up for left hand tremor HISTORY FROM: Patient and wifeCarol  HISTORY OF PRESENT ILLNESS:UPDATE 3/20/2019CM [name], 52 year old male returns for follow-up for left hand tremor.  He was just seen by Dr. Frances Furbish.  the end of February.  He states his tremor has worsened since last seen it is an intermittent tremor that can progress to hand shaking.  There is no family history of Parkinson's disease or essential tremor.  The tremor usually occurs when he is at rest in the recliner at home.  It usually does not happen at work.  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  The tremor has been going on since September of last year MRI of the brain was normal 09/10/17 which is reassuring.  He has had routine labs through his primary care however no thyroid function studies.  He does not smoke occasionally has alcohol.  Drinks 2 cups of coffee a day.  He returns for reevaluation  09/03/17 [name] is a 52 year old right-handed gentleman with an underlying medical history cervical disc disease with status post neck surgery, history of anxiety, hyperlipidemia, and obesity, who reports approximately  month history of left hand tremor. He notices a tremor in the left hand from the elbow down, usually at rest or with standing or walking. He has not noticed any other fine motor difficulties, no balance issues, no other tremor, no problem standing or walking, no difficulty performing his job. His wife has noticed more mood irritability. Of note, he has been on Lexapro for the past 2 years and has not noticed any tremor before. He has no family history of essential tremor or Parkinson's disease. He has been on Xanax 0.5 mg at night for the past 2 years or so, this started when he was on third shift and help him achieve sleep a little better. He has been on first shift now. He works as a Curator. He had routine blood  work through your office recently within the past 6 months. He was given a prescription recently for propranolol but decided not to fill it. He is not sure when his thyroid function was tested last. He had neck surgery under Dr. Newell Coral in December 2013 with good outcome. His last neck MRI was in November 2013. He is married and lives with his wife and his son. He is a nonsmoker, drinks alcohol occasionally, drinks caffeine in the form of coffee, 2 cups per day on average.    REVIEW OF SYSTEMS: Full 14 system review of systems performed and notable only for those listed, all others are neg:  Constitutional: neg  Cardiovascular: neg Ear/Nose/Throat: neg  Skin: neg Eyes: neg Respiratory: neg Gastroitestinal: neg  Hematology/Lymphatic: neg  Endocrine: neg Musculoskeletal:neg Allergy/Immunology: neg Neurological: Tremor left hand, mood change Psychiatric: Depression Sleep : neg   ALLERGIES: No Known Allergies  HOME MEDICATIONS: Outpatient Medications Prior to Visit  Medication Sig Dispense Refill  . ALPRAZolam (XANAX) 0.5 MG tablet Take 0.25 mg by mouth at bedtime.     Marland Kitchen escitalopram (LEXAPRO) 10 MG tablet Take 10 mg by mouth at bedtime.    Marland Kitchen ibuprofen (ADVIL,MOTRIN) 200 MG tablet Take 600 mg by mouth every 8 (eight) hours as needed (for pain).     No facility-administered medications prior to visit.     PAST MEDICAL HISTORY: Past Medical History:  Diagnosis Date  . Anxiety    "mostly to  unwind and go to sleep, works third shift"  . Bronchitis   . Hyperlipidemia    controlled with diet  . Pneumonia    hx of  . Tremor     PAST SURGICAL HISTORY: Past Surgical History:  Procedure Laterality Date  . ANTERIOR CERVICAL DECOMP/DISCECTOMY FUSION  05/21/2012   Procedure: ANTERIOR CERVICAL DECOMPRESSION/DISCECTOMY FUSION 2 LEVELS;  Surgeon: Hewitt Shorts, MD;  Location: [facility];  Service: Neurosurgery;  Laterality: Bilateral;  Cervical five-six, six-seven Anterior  cervical decompression/diskectomy/fusion  . APPENDECTOMY    . COLONOSCOPY WITH PROPOFOL N/A 06/05/2016   Procedure: COLONOSCOPY WITH PROPOFOL;  Surgeon: Corbin Ade, MD;  Location: [facility];  Service: Endoscopy;  Laterality: N/A;  730   . HERNIA REPAIR     umbilical    FAMILY HISTORY: Family History  Problem Relation Age of Onset  . Colon cancer Father 52       Surgical treatment, remission, recurrence with METS to Liver and has passed away age 58    SOCIAL HISTORY: Social History   Socioeconomic History  . Marital status: Married    Spouse name: Not on file  . Number of children: 2  . Years of education: Not on file  . Highest education level: Not on file  Social Needs  . Financial resource strain: Not on file  . Food insecurity - worry: Not on file  . Food insecurity - inability: Not on file  . Transportation needs - medical: Not on file  . Transportation needs - non-medical: Not on file  Occupational History  . Not on file  Tobacco Use  . Smoking status: Never Smoker  . Smokeless tobacco: Never Used  Substance and Sexual Activity  . Alcohol use: Yes    Comment: Beer 1-2 times a week  . Drug use: No  . Sexual activity: Not on file  Other Topics Concern  . Not on file  Social History Narrative   Lives at home with his wife & son   Right handed   Drinks 4 cups of coffee daily     PHYSICAL EXAM  Vitals:   09/23/17 0800  BP: (!) 144/85  Pulse: 68  Weight: 218 lb 9.6 oz (99.2 kg)  Height: 5\' 8"  (1.727 m)   Body mass index is 33.24 kg/m.  Generalized: Well developed, obese male in no acute distress , well-groomed, Head: normocephalic and atraumatic,. Oropharynx benign  Neck: Supple,   Musculoskeletal: No deformity   Neurological examination   Mentation: Alert oriented to time, place, history taking. Attention span and concentration appropriate. Recent and remote memory intact.  Follows all commands speech and language fluent.   Cranial nerve  II-XII: Pupils were equal round reactive to light extraocular movements were full, visual field were full on confrontational test. Facial sensation and strength were normal. hearing was intact to finger rubbing bilaterally. Uvula tongue midline. head turning and shoulder shrug were normal and symmetric.Tongue protrusion into cheek strength was normal. Motor: normal bulk and tone, full strength in the BUE, BLE, with the exception of mild increase in tone left upper extremity.  Intermittent resting tremor left hand.  No significant postural or action tremor no difficulty with finger tap.  Spiral drawing has no significant difficulty in handwriting is legible not tremulous not micrographic  Sensory: normal and symmetric to light touch, pinprick, and  Vibration, in the upper and lower extremities Coordination: finger-nose-finger, heel-to-shin bilaterally, no dysmetria Reflexes: Symmetric upper and lower, plantar responses were flexor bilaterally. Gait  and Station: Rising up from seated position without assistance, mildly stooped posture, mild decreased arm swing, no difficulty with turns.  DIAGNOSTIC DATA (LABS, IMAGING, TESTING) - I reviewed patient records, labs, notes, testing and imaging myself where available.  Lab Results  Component Value Date   WBC 8.6 05/27/2016   HGB 13.9 05/27/2016   HCT 40.0 05/27/2016   MCV 87.5 05/27/2016   PLT 280 05/27/2016      Component Value Date/Time   NA 136 05/27/2016 1442   K 3.5 05/27/2016 1442   CL 103 05/27/2016 1442   CO2 28 05/27/2016 1442   GLUCOSE 119 (H) 05/27/2016 1442   BUN 10 05/27/2016 1442   CREATININE 0.93 05/27/2016 1442   CALCIUM 8.9 05/27/2016 1442   PROT 7.4 03/01/2015 1952   ALBUMIN 4.1 03/01/2015 1952   AST 23 03/01/2015 1952   ALT 32 03/01/2015 1952   ALKPHOS 92 03/01/2015 1952   BILITOT 0.6 03/01/2015 1952   GFRNONAA >60 05/27/2016 1442   GFRAA >60 05/27/2016 1442   ASSESSMENT AND PLAN   Lorris Sarita Haver Devinney is a very pleasant 52  y.o.-year old male with an underlying medical history cervical disc disease with status post neck surgery, history of anxiety, hyperlipidemia, and obesity, who presents for F/U  for a left hand tremor for the past 6 months with some progression noted. On examination, he has a very mild intermittent resting tremor in the left hand, perhaps slight increase in tone noted.He has a benign exam otherwise. He has a mild decrease in arm swing on the left. He is advised that his tremor is rather mild, findings not telltale for any definitive diagnosis. Brain MRI with and without contrast to rule out any structural cause or prior stroke as a potential cause of his tremor was normal.   Currrently continue to  monitor his symptoms and his clinical findings. Parkinsonism or left-sided predominant Parkinson's disease is certainly within the considerations.  Patient has not had thyroid studies done.    Discussed with Dr. Frances FurbishAthar Will check thyroid panel today and forward to PCP Dr. Margo AyeHall Continue to monitor tremor exercise on a regular basis, keep well hydrated with water and well rested. Discussed symptoms and signs of hypothyroidism which are shaking hands nervousness, inability to tolerate heat palpitations fatigue restlessness sleep problems F/U with Dr. Frances FurbishAthar in 3 months I spent 25 minutes in total face to face time with the patient more than 50% of which was spent counseling and coordination of care, reviewing test results reviewing medications and discussing and reviewing the diagnosis of tremor and further testing . [name], [facility] HospitalGNP, [facility] HospitalBC, APRN  [facility] [address], [address] [place], [place] [address] [phone]  I reviewed the above note and documentation by the Nurse Practitioner and agree with the history, physical exam, assessment and plan as outlined above. I was immediately available for face-to-face consultation and discussed plan with [name]. Huston FoleySaima Athar, MD, PhD [facility])

## 2017-09-23 ENCOUNTER — Ambulatory Visit: Payer: BLUE CROSS/BLUE SHIELD | Admitting: Nurse Practitioner

## 2017-09-23 ENCOUNTER — Encounter: Payer: Self-pay | Admitting: Nurse Practitioner

## 2017-09-23 DIAGNOSIS — R251 Tremor, unspecified: Secondary | ICD-10-CM

## 2017-09-23 NOTE — Patient Instructions (Signed)
Will check thyroid panel today and forward to PCP Dr. Margo AyeHall Continue to monitor tremor F/U with Dr. Frances FurbishAthar in 3 months

## 2017-09-24 LAB — THYROID PANEL WITH TSH
Free Thyroxine Index: 1.3 (ref 1.2–4.9)
T3 Uptake Ratio: 21 % — ABNORMAL LOW (ref 24–39)
T4 TOTAL: 6.2 ug/dL (ref 4.5–12.0)
TSH: 3.27 u[IU]/mL (ref 0.450–4.500)

## 2017-09-25 ENCOUNTER — Telehealth: Payer: Self-pay | Admitting: Neurology

## 2017-09-25 NOTE — Telephone Encounter (Signed)
Called to go over lab work with the pt. No answer. LVM with the results and the instructions by Darrol Angelarolyn Martin, NP. Instructed the pt to call back if he has questions.

## 2017-09-25 NOTE — Telephone Encounter (Signed)
-----   Message from Nilda RiggsNancy Carolyn Martin, NP sent at 09/25/2017  9:25 AM EDT ----- Discussed with Dr. Frances FurbishAthar. Thyroid studies normal. Will send copy to PCP. Continue F/U with PCP and keep follow up appt with her.

## 2017-11-02 DIAGNOSIS — J060 Acute laryngopharyngitis: Secondary | ICD-10-CM | POA: Diagnosis not present

## 2017-11-02 DIAGNOSIS — Z6832 Body mass index (BMI) 32.0-32.9, adult: Secondary | ICD-10-CM | POA: Diagnosis not present

## 2017-11-18 DIAGNOSIS — R05 Cough: Secondary | ICD-10-CM | POA: Diagnosis not present

## 2017-11-24 DIAGNOSIS — R259 Unspecified abnormal involuntary movements: Secondary | ICD-10-CM | POA: Diagnosis not present

## 2017-12-25 DIAGNOSIS — R05 Cough: Secondary | ICD-10-CM | POA: Diagnosis not present

## 2017-12-25 DIAGNOSIS — R252 Cramp and spasm: Secondary | ICD-10-CM | POA: Diagnosis not present

## 2017-12-25 DIAGNOSIS — F339 Major depressive disorder, recurrent, unspecified: Secondary | ICD-10-CM | POA: Diagnosis not present

## 2017-12-25 DIAGNOSIS — R251 Tremor, unspecified: Secondary | ICD-10-CM | POA: Diagnosis not present

## 2018-02-16 DIAGNOSIS — Z79899 Other long term (current) drug therapy: Secondary | ICD-10-CM | POA: Diagnosis not present

## 2018-02-16 DIAGNOSIS — G20 Parkinson's disease: Secondary | ICD-10-CM | POA: Diagnosis not present

## 2018-03-01 ENCOUNTER — Ambulatory Visit: Payer: BLUE CROSS/BLUE SHIELD | Admitting: Neurology

## 2018-03-03 ENCOUNTER — Ambulatory Visit: Payer: BLUE CROSS/BLUE SHIELD | Admitting: Neurology

## 2018-03-16 DIAGNOSIS — R259 Unspecified abnormal involuntary movements: Secondary | ICD-10-CM | POA: Diagnosis not present

## 2018-03-25 DIAGNOSIS — G20 Parkinson's disease: Secondary | ICD-10-CM | POA: Diagnosis not present

## 2018-03-25 DIAGNOSIS — R259 Unspecified abnormal involuntary movements: Secondary | ICD-10-CM | POA: Diagnosis not present

## 2018-04-07 DIAGNOSIS — Z0000 Encounter for general adult medical examination without abnormal findings: Secondary | ICD-10-CM | POA: Diagnosis not present

## 2018-04-12 DIAGNOSIS — F419 Anxiety disorder, unspecified: Secondary | ICD-10-CM | POA: Diagnosis not present

## 2018-04-12 DIAGNOSIS — E782 Mixed hyperlipidemia: Secondary | ICD-10-CM | POA: Diagnosis not present

## 2018-04-12 DIAGNOSIS — Z0000 Encounter for general adult medical examination without abnormal findings: Secondary | ICD-10-CM | POA: Diagnosis not present

## 2018-04-12 DIAGNOSIS — G20 Parkinson's disease: Secondary | ICD-10-CM | POA: Diagnosis not present

## 2018-05-25 DIAGNOSIS — F419 Anxiety disorder, unspecified: Secondary | ICD-10-CM | POA: Diagnosis not present

## 2018-05-25 DIAGNOSIS — G20 Parkinson's disease: Secondary | ICD-10-CM | POA: Diagnosis not present

## 2018-06-25 DIAGNOSIS — K219 Gastro-esophageal reflux disease without esophagitis: Secondary | ICD-10-CM | POA: Diagnosis not present

## 2018-06-25 DIAGNOSIS — F419 Anxiety disorder, unspecified: Secondary | ICD-10-CM | POA: Diagnosis not present

## 2018-06-25 DIAGNOSIS — G20 Parkinson's disease: Secondary | ICD-10-CM | POA: Diagnosis not present

## 2018-06-25 DIAGNOSIS — F339 Major depressive disorder, recurrent, unspecified: Secondary | ICD-10-CM | POA: Diagnosis not present

## 2018-08-11 DIAGNOSIS — E782 Mixed hyperlipidemia: Secondary | ICD-10-CM | POA: Diagnosis not present

## 2018-08-11 DIAGNOSIS — E669 Obesity, unspecified: Secondary | ICD-10-CM | POA: Diagnosis not present

## 2018-08-12 DIAGNOSIS — Z79899 Other long term (current) drug therapy: Secondary | ICD-10-CM | POA: Diagnosis not present

## 2018-08-12 DIAGNOSIS — G20 Parkinson's disease: Secondary | ICD-10-CM | POA: Diagnosis not present

## 2018-08-14 DIAGNOSIS — J029 Acute pharyngitis, unspecified: Secondary | ICD-10-CM | POA: Diagnosis not present

## 2018-08-16 DIAGNOSIS — F419 Anxiety disorder, unspecified: Secondary | ICD-10-CM | POA: Diagnosis not present

## 2018-08-16 DIAGNOSIS — K219 Gastro-esophageal reflux disease without esophagitis: Secondary | ICD-10-CM | POA: Diagnosis not present

## 2018-08-16 DIAGNOSIS — F339 Major depressive disorder, recurrent, unspecified: Secondary | ICD-10-CM | POA: Diagnosis not present

## 2018-08-16 DIAGNOSIS — G20 Parkinson's disease: Secondary | ICD-10-CM | POA: Diagnosis not present

## 2018-11-03 DIAGNOSIS — F339 Major depressive disorder, recurrent, unspecified: Secondary | ICD-10-CM | POA: Diagnosis not present

## 2018-11-03 DIAGNOSIS — G20 Parkinson's disease: Secondary | ICD-10-CM | POA: Diagnosis not present

## 2018-11-03 DIAGNOSIS — F419 Anxiety disorder, unspecified: Secondary | ICD-10-CM | POA: Diagnosis not present

## 2018-11-03 DIAGNOSIS — K219 Gastro-esophageal reflux disease without esophagitis: Secondary | ICD-10-CM | POA: Diagnosis not present

## 2019-01-12 DIAGNOSIS — G20 Parkinson's disease: Secondary | ICD-10-CM | POA: Diagnosis not present

## 2019-03-18 DIAGNOSIS — F339 Major depressive disorder, recurrent, unspecified: Secondary | ICD-10-CM | POA: Diagnosis not present

## 2019-03-18 DIAGNOSIS — F419 Anxiety disorder, unspecified: Secondary | ICD-10-CM | POA: Diagnosis not present

## 2019-03-18 DIAGNOSIS — G20 Parkinson's disease: Secondary | ICD-10-CM | POA: Diagnosis not present

## 2019-03-21 DIAGNOSIS — G20 Parkinson's disease: Secondary | ICD-10-CM | POA: Diagnosis not present

## 2019-04-13 DIAGNOSIS — E782 Mixed hyperlipidemia: Secondary | ICD-10-CM | POA: Diagnosis not present

## 2019-04-13 DIAGNOSIS — E669 Obesity, unspecified: Secondary | ICD-10-CM | POA: Diagnosis not present

## 2019-04-20 DIAGNOSIS — Z23 Encounter for immunization: Secondary | ICD-10-CM | POA: Diagnosis not present

## 2019-04-27 DIAGNOSIS — G20 Parkinson's disease: Secondary | ICD-10-CM | POA: Diagnosis not present

## 2019-07-19 DIAGNOSIS — F339 Major depressive disorder, recurrent, unspecified: Secondary | ICD-10-CM | POA: Diagnosis not present

## 2019-07-19 DIAGNOSIS — F419 Anxiety disorder, unspecified: Secondary | ICD-10-CM | POA: Diagnosis not present

## 2019-07-19 DIAGNOSIS — G20 Parkinson's disease: Secondary | ICD-10-CM | POA: Diagnosis not present

## 2019-08-10 DIAGNOSIS — G20 Parkinson's disease: Secondary | ICD-10-CM | POA: Diagnosis not present

## 2019-08-12 DIAGNOSIS — F339 Major depressive disorder, recurrent, unspecified: Secondary | ICD-10-CM | POA: Diagnosis not present

## 2019-08-12 DIAGNOSIS — F0630 Mood disorder due to known physiological condition, unspecified: Secondary | ICD-10-CM | POA: Diagnosis not present

## 2019-08-12 DIAGNOSIS — G20 Parkinson's disease: Secondary | ICD-10-CM | POA: Diagnosis not present

## 2019-08-12 DIAGNOSIS — M79671 Pain in right foot: Secondary | ICD-10-CM | POA: Diagnosis not present

## 2019-08-24 ENCOUNTER — Other Ambulatory Visit: Payer: Self-pay

## 2019-08-24 ENCOUNTER — Other Ambulatory Visit: Payer: Self-pay | Admitting: Podiatry

## 2019-08-24 ENCOUNTER — Ambulatory Visit (INDEPENDENT_AMBULATORY_CARE_PROVIDER_SITE_OTHER): Payer: BC Managed Care – PPO

## 2019-08-24 ENCOUNTER — Ambulatory Visit: Payer: BC Managed Care – PPO | Admitting: Podiatry

## 2019-08-24 DIAGNOSIS — M722 Plantar fascial fibromatosis: Secondary | ICD-10-CM

## 2019-08-24 DIAGNOSIS — M79671 Pain in right foot: Secondary | ICD-10-CM

## 2019-08-25 ENCOUNTER — Encounter: Payer: Self-pay | Admitting: Podiatry

## 2019-08-25 NOTE — Progress Notes (Signed)
Subjective:  Patient ID: [name], male    DOB: Jul 28, 1965,  MRN: [id number]  Chief Complaint  Patient presents with  . Foot Pain    pt is here for right foot pain possible bone spur, at the bottom of the right heel, pain is on and off, and has been going on for about a month and a half, pt states that it is a shooting sensation, pt has tried prednisone, but not much has been helping. Pt puts pain scale as a 8 out of 10, pt also has parkinsons, and balance issues as well.     54 y.o. male presents with the above complaint.  Patient presents with right heel pain with a possible underlying bone spur.  The patient states that the heel pain is on the bottom of the foot.  The pain is on and off.  Has been going for about 1-1/2 months.  He states it is shooting/tingling/numbness type of pain.  Patient has tried prednisone by mouth but has not much been helping.  Pain scale is 8 out of 10 he has been dealing with this for quite some long time.  Patient also has Parkinson's and balance issues as well.  He would like to know if there is anything that could be done for this pain.  He has not seek any other treatment options or any other physicians to help evaluate this.   Review of Systems: Negative except as noted in the HPI. Denies N/V/F/Ch.  Past Medical History:  Diagnosis Date  . Anxiety    "mostly to unwind and go to sleep, works third shift"  . Bronchitis   . Hyperlipidemia    controlled with diet  . Pneumonia    hx of  . Tremor     Current Outpatient Medications:  .  ALPRAZolam (XANAX) 0.5 MG tablet, Take 0.25 mg by mouth at bedtime. , Disp: , Rfl:  .  Amantadine HCl 100 MG tablet, Take 100 mg by mouth 3 (three) times daily., Disp: , Rfl:  .  carbidopa-levodopa (SINEMET IR) 25-100 MG tablet, Take by mouth., Disp: , Rfl:  .  citalopram (CELEXA) 40 MG tablet, Take 40 mg by mouth daily., Disp: , Rfl:  .  escitalopram (LEXAPRO) 10 MG tablet, Take 10 mg by mouth at bedtime., Disp: , Rfl:    .  ibuprofen (ADVIL,MOTRIN) 200 MG tablet, Take 600 mg by mouth every 8 (eight) hours as needed (for pain)., Disp: , Rfl:  .  lamoTRIgine (LAMICTAL) 25 MG tablet, Take 50 mg by mouth at bedtime., Disp: , Rfl:  .  Melatonin 10 MG TABS, Take by mouth., Disp: , Rfl:  .  methylPREDNISolone (MEDROL DOSEPAK) 4 MG TBPK tablet, TAKE 6 TABLETS ON DAY 1 AS DIRECTED ON PACKAGE AND DECREASE BY 1 TAB EACH DAY FOR A TOTAL OF 6 DAYS, Disp: , Rfl:  .  Multiple Vitamin (MULTIVITAMIN) capsule, Take by mouth., Disp: , Rfl:  .  omeprazole (PRILOSEC) 20 MG capsule, Take 20 mg by mouth daily., Disp: , Rfl:  .  ondansetron (ZOFRAN) 4 MG tablet, , Disp: , Rfl:  .  pramipexole (MIRAPEX) 0.25 MG tablet, TAKE ONE TABLET (0.25MG  TOTAL) BY MOUTH THREE TIMES DAILY, Disp: , Rfl:  .  vitamin B-12 (CYANOCOBALAMIN) 500 MCG tablet, Take by mouth., Disp: , Rfl:   Social History   Tobacco Use  Smoking Status Never Smoker  Smokeless Tobacco Never Used    No Known Allergies Objective:  There were no vitals filed  for this visit. There is no height or weight on file to calculate BMI. Constitutional Well developed. Well nourished.  Vascular Dorsalis pedis pulses palpable bilaterally. Posterior tibial pulses palpable bilaterally. Capillary refill normal to all digits.  No cyanosis or clubbing noted. Pedal hair growth normal.  Neurologic Normal speech. Oriented to person, place, and time. Epicritic sensation to light touch grossly present bilaterally.  Dermatologic Nails well groomed and normal in appearance. No open wounds. No skin lesions.  Orthopedic: Normal joint ROM without pain or crepitus bilaterally. No visible deformities. Tender to palpation at the calcaneal tuber right. No pain with calcaneal squeeze right. Ankle ROM diminished range of motion right. Silfverskiold Test: positive right.   Radiographs: Taken and reviewed. No acute fractures or dislocations. No evidence of stress fracture.  Plantar heel spur  present. Posterior heel spur present.    Assessment:   1. Foot pain, right    Plan:  Patient was evaluated and treated and all questions answered.  Plantar Fasciitis, right - XR reviewed as above.  - Educated on icing and stretching. Instructions given.  - Injection delivered to the plantar fascia as below. - DME: Plantar Fascial Brace - Pharmacologic management: None  Procedure: Injection Tendon/Ligament Location: Right plantar fascia at the glabrous junction; medial approach. Skin Prep: alcohol Injectate: 0.5 cc 0.5% marcaine plain, 0.5 cc of 1% Lidocaine, 0.5 cc kenalog 10. Disposition: Patient tolerated procedure well. Injection site dressed with a band-aid.  No follow-ups on file.

## 2019-09-19 DIAGNOSIS — M79671 Pain in right foot: Secondary | ICD-10-CM | POA: Diagnosis not present

## 2019-09-19 DIAGNOSIS — G20 Parkinson's disease: Secondary | ICD-10-CM | POA: Diagnosis not present

## 2019-09-19 DIAGNOSIS — F0630 Mood disorder due to known physiological condition, unspecified: Secondary | ICD-10-CM | POA: Diagnosis not present

## 2019-09-19 DIAGNOSIS — F339 Major depressive disorder, recurrent, unspecified: Secondary | ICD-10-CM | POA: Diagnosis not present

## 2019-09-23 ENCOUNTER — Ambulatory Visit: Payer: BC Managed Care – PPO | Admitting: Podiatry

## 2019-10-24 DIAGNOSIS — F0630 Mood disorder due to known physiological condition, unspecified: Secondary | ICD-10-CM | POA: Diagnosis not present

## 2019-10-24 DIAGNOSIS — E669 Obesity, unspecified: Secondary | ICD-10-CM | POA: Diagnosis not present

## 2019-10-24 DIAGNOSIS — F1320 Sedative, hypnotic or anxiolytic dependence, uncomplicated: Secondary | ICD-10-CM | POA: Diagnosis not present

## 2019-10-24 DIAGNOSIS — E782 Mixed hyperlipidemia: Secondary | ICD-10-CM | POA: Diagnosis not present

## 2019-10-26 DIAGNOSIS — F419 Anxiety disorder, unspecified: Secondary | ICD-10-CM | POA: Diagnosis not present

## 2019-10-26 DIAGNOSIS — F339 Major depressive disorder, recurrent, unspecified: Secondary | ICD-10-CM | POA: Diagnosis not present

## 2019-10-26 DIAGNOSIS — F0630 Mood disorder due to known physiological condition, unspecified: Secondary | ICD-10-CM | POA: Diagnosis not present

## 2019-10-26 DIAGNOSIS — G20 Parkinson's disease: Secondary | ICD-10-CM | POA: Diagnosis not present

## 2019-10-26 DIAGNOSIS — K219 Gastro-esophageal reflux disease without esophagitis: Secondary | ICD-10-CM | POA: Diagnosis not present

## 2019-11-10 NOTE — Progress Notes (Addendum)
Virtual Visit via Video Note  I connected with Rickard Patience on 11/17/19 at 11:00 AM EDT by a video enabled telemedicine application and verified that I am speaking with the correct person using two identifiers.   I discussed the limitations of evaluation and management by telemedicine and the availability of in person appointments. The patient expressed understanding and agreed to proceed.     I discussed the assessment and treatment plan with the patient. The patient was provided an opportunity to ask questions and all were answered. The patient agreed with the plan and demonstrated an understanding of the instructions.   The patient was advised to call back or seek an in-person evaluation if the symptoms worsen or if the condition fails to improve as anticipated.  I provided 40 minutes of non-face-to-face time during this encounter.   Neysa Hotter, MD      Psychiatric Initial Adult Assessment   Patient Identification: JAARON OLESON MRN:  [id number] Date of Evaluation:  11/17/2019 Referral Source: Dr. Jaquita Folds Chief Complaint:   Chief Complaint    Depression; Establish Care    "lot of culture change" Visit Diagnosis:    ICD-10-CM   1. MDD (major depressive disorder), recurrent episode, mild (HCC)  F33.0     History of Present Illness:   CARR SHARTZER is a 54 y.o. year old male with a history of anxiety, parkinson's disease, hyperlipidemia, who is referred for anxiety.   He states that he has been struggling with the changes.  He was diagnosed with Parkinson disease in May 2019.  He has tremors, stiffness and memory loss. He is easily fatigued and has heat tolerance although he used to be very active. Although he tries to deal with it, it has been frustrating.  He also states that he is in the process of getting disability.  He feels sad, stating that he used to earn good money, and has been working since [employer]. He has had medication changes over the past year for  parkinson's diease. He was started on latuda recently and thinks that his mood is in better control since then except that he became drowsy from Latuda 40 mg. He reports good rapport with his primary care provider. He wants her to handle the medication and continue the care with her.   Mood- He has "good days, bad days, good week, bad weeks." He feels irritable and mad at the world at times. He enjoys racing. He has difficulty in concentration/memory. He denies SI, HI, AH, VH. He denies anxiety.  Substance- he drank a couple of beers a few days ago, occasional drink, denies substance use  His wife, Ms. Loyal,Carol presents to the interview.  He has been irritable and curses a lot. He does not want to be around with anybody. It is difficult to say if these are new things. She denies safety concern.   Medication- Latuda 20 mg daily (recently tapered from 40 mg), clonazepam 1 mg BID. He recently tried Citalopram/lexapro/Lamotrigine. He reportedly took genetic test.   Associated Signs/Symptoms: Depression Symptoms:  depressed mood, anhedonia, fatigue, difficulty concentrating, anxiety, (Hypo) Manic Symptoms:  Irritable Mood, Anxiety Symptoms:  denies Psychotic Symptoms:  denies AH, VH PTSD Symptoms: Negative  Past Psychiatric History:  Outpatient: treated for anger and anxiety in 2004 Psychiatry admission: SI in 2003 in the context of work stress Previous suicide attempt: denies  Past trials of medication: sertraline (drowsiness), citalopram, lexapro, venlafaxine,  Lamotrigine, latuda History of violence: denies   Previous  Psychotropic Medications: Yes   Substance Abuse History in the last 12 months:  No.  Consequences of Substance Abuse: NA  Past Medical History:  Past Medical History:  Diagnosis Date  . Anxiety    "mostly to unwind and go to sleep, works third shift"  . Bronchitis   . Hyperlipidemia    controlled with diet  . Pneumonia    hx of  . Tremor     Past Surgical  History:  Procedure Laterality Date  . ANTERIOR CERVICAL DECOMP/DISCECTOMY FUSION  05/21/2012   Procedure: ANTERIOR CERVICAL DECOMPRESSION/DISCECTOMY FUSION 2 LEVELS;  Surgeon: Hewitt Shorts, MD;  Location: [facility];  Service: Neurosurgery;  Laterality: Bilateral;  Cervical five-six, six-seven Anterior cervical decompression/diskectomy/fusion  . APPENDECTOMY    . COLONOSCOPY WITH PROPOFOL N/A 06/05/2016   Procedure: COLONOSCOPY WITH PROPOFOL;  Surgeon: Corbin Ade, MD;  Location: [facility];  Service: Endoscopy;  Laterality: N/A;  730   . HERNIA REPAIR     umbilical    Family Psychiatric History:  Denies    Family History:  Family History  Problem Relation Age of Onset  . Colon cancer Father 51       Surgical treatment, remission, recurrence with METS to Liver and has passed away age 28    Social History:   Social History   Socioeconomic History  . Marital status: Married    Spouse name: Not on file  . Number of children: 2  . Years of education: Not on file  . Highest education level: Not on file  Occupational History  . Not on file  Tobacco Use  . Smoking status: Never Smoker  . Smokeless tobacco: Never Used  Substance and Sexual Activity  . Alcohol use: Yes    Comment: Beer 1-2 times a week  . Drug use: No  . Sexual activity: Not on file  Other Topics Concern  . Not on file  Social History Narrative   Lives at home with his wife & son   Right handed   Drinks 4 cups of coffee daily   Social Determinants of Health   Financial Resource Strain:   . Difficulty of Paying Living Expenses:   Food Insecurity:   . Worried About Programme researcher, broadcasting/film/video in the Last Year:   . Barista in the Last Year:   Transportation Needs:   . Freight forwarder (Medical):   Marland Kitchen Lack of Transportation (Non-Medical):   Physical Activity:   . Days of Exercise per Week:   . Minutes of Exercise per Session:   Stress:   . Feeling of Stress :   Social Connections:    . Frequency of Communication with Friends and Family:   . Frequency of Social Gatherings with Friends and Family:   . Attends Religious Services:   . Active Member of Clubs or Organizations:   . 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 clonazePAM (KLONOPIN) 1 MG tablet Take 1 mg by mouth 2 (two) times daily.    Marland Kitchen lurasidone (LATUDA) 20 MG TABS tablet Take 20 mg by mouth daily with breakfast.    . Amantadine HCl 100 MG tablet Take 100 mg by mouth 3 (three) times daily.    . carbidopa-levodopa (SINEMET IR) 25-100 MG tablet Take by mouth.    Marland Kitchen ibuprofen (ADVIL,MOTRIN) 200 MG tablet Take 600 mg by mouth every 8 (eight) hours as needed (for pain).    . Melatonin 10 MG TABS Take by mouth.    . Multiple Vitamin (MULTIVITAMIN) capsule Take by mouth.    Marland Kitchen omeprazole (PRILOSEC) 20 MG capsule Take 20 mg by mouth daily.    . ondansetron (ZOFRAN) 4 MG tablet     . pramipexole (MIRAPEX) 0.25 MG tablet TAKE ONE TABLET (0.25MG  TOTAL) BY MOUTH THREE TIMES DAILY    . vitamin B-12 (CYANOCOBALAMIN) 500 MCG tablet Take by mouth.     No current facility-administered medications for this visit.    Musculoskeletal: Strength & Muscle Tone: N/A Gait &  Station: N/A Patient leans: N/A  Psychiatric Specialty Exam: Review of Systems  Psychiatric/Behavioral: Positive for decreased concentration and dysphoric mood. Negative for agitation, behavioral problems, confusion, hallucinations, self-injury, sleep disturbance and suicidal ideas. The patient is nervous/anxious. The patient is not hyperactive.   All other systems reviewed and are negative.   There were no vitals taken for this visit.There is no height or weight on file to calculate BMI.  General Appearance: Fairly Groomed  Eye Contact:  Good  Speech:  Clear and Coherent  Volume:  Normal  Mood:  Depressed  Affect:  Appropriate, Congruent and Restricted  Thought Process:  Coherent  Orientation:  Full (Time, Place, and Person)  Thought Content:  Logical  Suicidal Thoughts:  No  Homicidal Thoughts:  No  Memory:  Immediate;   Good  Judgement:  Good  Insight:  Fair  Psychomotor Activity:  Normal  Concentration:  Concentration: Good and Attention Span: Good  Recall:  Good  Fund of Knowledge:Good  Language: Good  Akathisia:  No  Handed:  Right  AIMS (if indicated):  not done  Assets:  Communication Skills Desire for Improvement  ADL's:  Intact  Cognition: WNL  Sleep:  Good   Screenings:   Assessment and Plan:  SANTE BIEDERMANN is a 54 y.o. year old male with a history of anxiety, parkinson's disease, hyperlipidemia, who is referred for anxiety.    # MDD, mild, recurrent without psychotic features He reports depressive symptoms and irritability, being demoralized by medical condition of Parkinson's disease.  Other psychosocial stressors includes being on disability.  He reports strong preference for his medication to be handled by his primary care provider.  Although he will benefit from starting antidepressant, he wants to continue the current medication regimen, and wants his care to be provided by his primary care provider.  He agrees to contact the office as needed.  Provided  psychoeducation about his medication regimen; potential risk of weight gain and EPS from [place]. He will greatly benefit from CBT; individual therapy is recommended (our therapist is not taking new patients). He declines referral to  IOP.   Plan 1. Continue Latuda 20 mg daily (drowsiness from higher dose) 2. Continue clonazepam 1 mg twice a  day 3. Return to clinic as needed    The patient demonstrates the following risk factors for suicide: Chronic risk factors for suicide include: psychiatric disorder of depression and medical illness parkinson's disease. Acute risk factors for suicide include: unemployment and loss (financial, interpersonal, professional). Protective factors for this patient include: positive social support and hope for the future. Considering these factors, the overall suicide risk at this point appears to be low. Patient is appropriate for outpatient follow up.   Norman Clay, MD 5/13/202111:53 AM

## 2019-11-17 ENCOUNTER — Telehealth (INDEPENDENT_AMBULATORY_CARE_PROVIDER_SITE_OTHER): Payer: BC Managed Care – PPO | Admitting: Psychiatry

## 2019-11-17 ENCOUNTER — Encounter (HOSPITAL_COMMUNITY): Payer: Self-pay | Admitting: Psychiatry

## 2019-11-17 ENCOUNTER — Other Ambulatory Visit: Payer: Self-pay

## 2019-11-17 DIAGNOSIS — F330 Major depressive disorder, recurrent, mild: Secondary | ICD-10-CM

## 2019-11-17 NOTE — Patient Instructions (Addendum)
1. Continue Latuda 20 mg daily  2. Continue clonazepam 1 mg twice a day 3. Return to clinic as needed  4. Consider referral to individual thearpy

## 2019-11-22 DIAGNOSIS — Z0289 Encounter for other administrative examinations: Secondary | ICD-10-CM

## 2019-12-26 DIAGNOSIS — G20 Parkinson's disease: Secondary | ICD-10-CM | POA: Diagnosis not present

## 2019-12-26 DIAGNOSIS — F0630 Mood disorder due to known physiological condition, unspecified: Secondary | ICD-10-CM | POA: Diagnosis not present

## 2019-12-26 DIAGNOSIS — F419 Anxiety disorder, unspecified: Secondary | ICD-10-CM | POA: Diagnosis not present

## 2020-01-04 DIAGNOSIS — F419 Anxiety disorder, unspecified: Secondary | ICD-10-CM | POA: Diagnosis not present

## 2020-01-04 DIAGNOSIS — R4689 Other symptoms and signs involving appearance and behavior: Secondary | ICD-10-CM | POA: Diagnosis not present

## 2020-01-04 DIAGNOSIS — G20 Parkinson's disease: Secondary | ICD-10-CM | POA: Diagnosis not present

## 2020-01-04 DIAGNOSIS — R4189 Other symptoms and signs involving cognitive functions and awareness: Secondary | ICD-10-CM | POA: Diagnosis not present

## 2020-01-20 DIAGNOSIS — G20 Parkinson's disease: Secondary | ICD-10-CM | POA: Diagnosis not present

## 2020-01-20 DIAGNOSIS — F0630 Mood disorder due to known physiological condition, unspecified: Secondary | ICD-10-CM | POA: Diagnosis not present

## 2020-01-20 DIAGNOSIS — F419 Anxiety disorder, unspecified: Secondary | ICD-10-CM | POA: Diagnosis not present

## 2020-01-31 DIAGNOSIS — G478 Other sleep disorders: Secondary | ICD-10-CM | POA: Diagnosis not present

## 2020-01-31 DIAGNOSIS — F329 Major depressive disorder, single episode, unspecified: Secondary | ICD-10-CM | POA: Diagnosis not present

## 2020-01-31 DIAGNOSIS — R441 Visual hallucinations: Secondary | ICD-10-CM | POA: Diagnosis not present

## 2020-01-31 DIAGNOSIS — R5383 Other fatigue: Secondary | ICD-10-CM | POA: Diagnosis not present

## 2020-01-31 DIAGNOSIS — G20 Parkinson's disease: Secondary | ICD-10-CM | POA: Diagnosis not present

## 2020-01-31 DIAGNOSIS — F419 Anxiety disorder, unspecified: Secondary | ICD-10-CM | POA: Diagnosis not present

## 2020-01-31 DIAGNOSIS — R453 Demoralization and apathy: Secondary | ICD-10-CM | POA: Diagnosis not present

## 2020-01-31 DIAGNOSIS — R292 Abnormal reflex: Secondary | ICD-10-CM | POA: Diagnosis not present

## 2020-01-31 DIAGNOSIS — R454 Irritability and anger: Secondary | ICD-10-CM | POA: Diagnosis not present

## 2020-02-13 DIAGNOSIS — Z712 Person consulting for explanation of examination or test findings: Secondary | ICD-10-CM | POA: Diagnosis not present

## 2020-02-13 DIAGNOSIS — F3289 Other specified depressive episodes: Secondary | ICD-10-CM | POA: Diagnosis not present

## 2020-02-13 DIAGNOSIS — G20 Parkinson's disease: Secondary | ICD-10-CM | POA: Diagnosis not present

## 2020-02-13 DIAGNOSIS — F419 Anxiety disorder, unspecified: Secondary | ICD-10-CM | POA: Diagnosis not present

## 2020-04-16 DIAGNOSIS — G20 Parkinson's disease: Secondary | ICD-10-CM | POA: Diagnosis not present

## 2020-05-07 DIAGNOSIS — Z23 Encounter for immunization: Secondary | ICD-10-CM | POA: Diagnosis not present

## 2020-05-07 DIAGNOSIS — F419 Anxiety disorder, unspecified: Secondary | ICD-10-CM | POA: Diagnosis not present

## 2020-05-07 DIAGNOSIS — G20 Parkinson's disease: Secondary | ICD-10-CM | POA: Diagnosis not present

## 2020-05-07 DIAGNOSIS — F0630 Mood disorder due to known physiological condition, unspecified: Secondary | ICD-10-CM | POA: Diagnosis not present

## 2020-10-02 DIAGNOSIS — G4752 REM sleep behavior disorder: Secondary | ICD-10-CM | POA: Diagnosis not present

## 2020-10-02 DIAGNOSIS — R1319 Other dysphagia: Secondary | ICD-10-CM | POA: Diagnosis not present

## 2020-10-02 DIAGNOSIS — G20 Parkinson's disease: Secondary | ICD-10-CM | POA: Diagnosis not present

## 2020-10-17 DIAGNOSIS — Z981 Arthrodesis status: Secondary | ICD-10-CM | POA: Diagnosis not present

## 2020-10-17 DIAGNOSIS — E669 Obesity, unspecified: Secondary | ICD-10-CM | POA: Diagnosis not present

## 2020-10-17 DIAGNOSIS — F419 Anxiety disorder, unspecified: Secondary | ICD-10-CM | POA: Diagnosis not present

## 2020-10-17 DIAGNOSIS — Z6834 Body mass index (BMI) 34.0-34.9, adult: Secondary | ICD-10-CM | POA: Diagnosis not present

## 2020-10-17 DIAGNOSIS — E782 Mixed hyperlipidemia: Secondary | ICD-10-CM | POA: Diagnosis not present

## 2020-10-17 DIAGNOSIS — F39 Unspecified mood [affective] disorder: Secondary | ICD-10-CM | POA: Diagnosis not present

## 2020-10-22 DIAGNOSIS — Z0001 Encounter for general adult medical examination with abnormal findings: Secondary | ICD-10-CM | POA: Diagnosis not present

## 2020-12-17 DIAGNOSIS — F339 Major depressive disorder, recurrent, unspecified: Secondary | ICD-10-CM | POA: Diagnosis not present

## 2020-12-17 DIAGNOSIS — G20 Parkinson's disease: Secondary | ICD-10-CM | POA: Diagnosis not present

## 2020-12-17 DIAGNOSIS — K219 Gastro-esophageal reflux disease without esophagitis: Secondary | ICD-10-CM | POA: Diagnosis not present

## 2020-12-17 DIAGNOSIS — F419 Anxiety disorder, unspecified: Secondary | ICD-10-CM | POA: Diagnosis not present

## 2021-04-18 DIAGNOSIS — E782 Mixed hyperlipidemia: Secondary | ICD-10-CM | POA: Diagnosis not present

## 2021-04-26 DIAGNOSIS — G20 Parkinson's disease: Secondary | ICD-10-CM | POA: Diagnosis not present

## 2021-05-02 DIAGNOSIS — Z23 Encounter for immunization: Secondary | ICD-10-CM | POA: Diagnosis not present

## 2021-05-02 DIAGNOSIS — G20 Parkinson's disease: Secondary | ICD-10-CM | POA: Diagnosis not present

## 2021-05-02 DIAGNOSIS — E782 Mixed hyperlipidemia: Secondary | ICD-10-CM | POA: Diagnosis not present

## 2021-05-02 DIAGNOSIS — K219 Gastro-esophageal reflux disease without esophagitis: Secondary | ICD-10-CM | POA: Diagnosis not present

## 2021-05-02 DIAGNOSIS — F339 Major depressive disorder, recurrent, unspecified: Secondary | ICD-10-CM | POA: Diagnosis not present

## 2021-06-24 DIAGNOSIS — J019 Acute sinusitis, unspecified: Secondary | ICD-10-CM | POA: Diagnosis not present

## 2021-07-18 ENCOUNTER — Encounter: Payer: Self-pay | Admitting: *Deleted

## 2021-08-01 ENCOUNTER — Encounter: Payer: Self-pay | Admitting: *Deleted

## 2021-08-26 ENCOUNTER — Ambulatory Visit (INDEPENDENT_AMBULATORY_CARE_PROVIDER_SITE_OTHER): Payer: Self-pay | Admitting: *Deleted

## 2021-08-26 ENCOUNTER — Other Ambulatory Visit: Payer: Self-pay

## 2021-08-26 VITALS — Ht 68.0 in | Wt 235.0 lb

## 2021-08-26 DIAGNOSIS — Z1211 Encounter for screening for malignant neoplasm of colon: Secondary | ICD-10-CM

## 2021-08-26 NOTE — Progress Notes (Addendum)
Gastroenterology Pre-Procedure Review  Request Date: 08/26/2021 Requesting Physician: 5 year recall, Last TCS done 06/05/2016 by Dr. Jena Gauss, normal colon, family hx of colon cancer (father)  PATIENT REVIEW QUESTIONS: The patient responded to the following health history questions as indicated:    1. Diabetes Melitis: no 2. Joint replacements in the past 12 months: no 3. Major health problems in the past 3 months: yes, Parkinson's disease 4. Has an artificial valve or MVP: no 5. Has a defibrillator: no 6. Has been advised in past to take antibiotics in advance of a procedure like teeth cleaning: no 7. Family history of colon cancer: yes, father: age 56's  8. Alcohol Use: yes, 8 beers a week 9. Illicit drug Use: no 10. History of sleep apnea: no 11. History of coronary artery or other vascular stents placed within the last 12 months: no 12. History of any prior anesthesia complications: no 13. Body mass index is 35.73 kg/m.    MEDICATIONS & ALLERGIES:    Patient reports the following regarding taking any blood thinners:   Plavix? no Aspirin? no Coumadin? no Brilinta? no Xarelto? no Eliquis? no Pradaxa? no Savaysa? no Effient? no  Patient confirms/reports the following medications:  Current Outpatient Medications  Medication Sig Dispense Refill   carbidopa-levodopa (SINEMET IR) 25-100 MG tablet Take by mouth 3 (three) times daily. Takes 2 tablets each x 3 times daily.     clonazePAM (KLONOPIN) 1 MG tablet Take 1 mg by mouth 3 (three) times daily.     desvenlafaxine (PRISTIQ) 25 MG 24 hr tablet Take 1 tablet by mouth daily.     docusate sodium (COLACE) 100 MG capsule Take 100 mg by mouth at bedtime.     ibuprofen (ADVIL,MOTRIN) 200 MG tablet Take 600 mg by mouth as needed (for pain).     Melatonin 10 MG TABS Take by mouth at bedtime.     meloxicam (MOBIC) 15 MG tablet Take 15 mg by mouth daily.     Multiple Vitamin (MULTIVITAMIN) capsule Take by mouth daily at 6 (six) AM.      omeprazole (PRILOSEC) 20 MG capsule Take 20 mg by mouth daily.     ondansetron (ZOFRAN) 4 MG tablet 4 mg as needed.     pramipexole (MIRAPEX) 0.25 MG tablet TAKE ONE TABLET (0.25MG  TOTAL) BY MOUTH THREE TIMES DAILY     vitamin B-12 (CYANOCOBALAMIN) 500 MCG tablet Take by mouth as needed.     No current facility-administered medications for this visit.    Patient confirms/reports the following allergies:  No Known Allergies  No orders of the defined types were placed in this encounter.   AUTHORIZATION INFORMATION Primary Insurance: [place],  [place] #: [id number],  Group #: [id number] Pre-Cert / Berkley Harvey required:  Pre-Cert / Auth #:   SCHEDULE INFORMATION: Procedure has been scheduled as follows:  Date: , Time:   Location: [facility] with Dr. Jena Gauss  This Gastroenterology Pre-Precedure Review Form is being routed to the following provider(s): Ermalinda Memos, PA-C

## 2021-08-27 NOTE — Progress Notes (Signed)
Okay to schedule with Dr. Jena Gauss with propofol.  ASA 2.

## 2021-08-28 DIAGNOSIS — G20 Parkinson's disease: Secondary | ICD-10-CM | POA: Diagnosis not present

## 2021-08-28 NOTE — Progress Notes (Signed)
Spoke to pt.  He was made aware that I will call him once Dr. Rourk's procedure schedules are available. °

## 2021-09-11 DIAGNOSIS — G20 Parkinson's disease: Secondary | ICD-10-CM | POA: Diagnosis not present

## 2021-09-16 NOTE — Progress Notes (Signed)
Lmom for pt to call me back. 

## 2021-09-17 ENCOUNTER — Encounter: Payer: Self-pay | Admitting: *Deleted

## 2021-10-07 MED ORDER — NA SULFATE-K SULFATE-MG SULF 17.5-3.13-1.6 GM/177ML PO SOLN: 1.0000 | Freq: Once | ORAL | 0 refills | Status: AC

## 2021-10-08 ENCOUNTER — Encounter: Payer: Self-pay | Admitting: *Deleted

## 2021-10-09 ENCOUNTER — Other Ambulatory Visit: Payer: Self-pay | Admitting: *Deleted

## 2021-11-04 ENCOUNTER — Encounter (HOSPITAL_COMMUNITY): Payer: Self-pay | Admitting: Internal Medicine

## 2021-11-04 ENCOUNTER — Ambulatory Visit (HOSPITAL_COMMUNITY): Payer: BC Managed Care – PPO | Admitting: Certified Registered"

## 2021-11-04 ENCOUNTER — Other Ambulatory Visit: Payer: Self-pay

## 2021-11-04 ENCOUNTER — Encounter (HOSPITAL_COMMUNITY)
Admission: RE | Disposition: A | Discharge status: Home / Self Care | Payer: Self-pay | Source: Home / Self Care | Attending: Internal Medicine

## 2021-11-04 ENCOUNTER — Ambulatory Visit (HOSPITAL_COMMUNITY)
Admission: RE | Admit: 2021-11-04 | Discharge: 2021-11-04 | Disposition: A | Discharge status: Home / Self Care | Payer: BC Managed Care – PPO | Attending: Internal Medicine | Admitting: Internal Medicine

## 2021-11-04 DIAGNOSIS — Z1211 Encounter for screening for malignant neoplasm of colon: Secondary | ICD-10-CM

## 2021-11-04 DIAGNOSIS — Z800 Family history of malignant neoplasm of digestive organs: Secondary | ICD-10-CM | POA: Diagnosis not present

## 2021-11-04 DIAGNOSIS — F419 Anxiety disorder, unspecified: Secondary | ICD-10-CM | POA: Insufficient documentation

## 2021-11-04 DIAGNOSIS — R251 Tremor, unspecified: Secondary | ICD-10-CM | POA: Diagnosis not present

## 2021-11-04 HISTORY — PX: COLONOSCOPY WITH PROPOFOL: SHX5780

## 2021-11-04 SURGERY — COLONOSCOPY WITH PROPOFOL
Anesthesia: General

## 2021-11-04 MED ORDER — STERILE WATER FOR IRRIGATION IR SOLN
Status: DC | PRN
  Administered 2021-11-04: 60 mL

## 2021-11-04 MED ORDER — PROPOFOL 500 MG/50ML IV EMUL
INTRAVENOUS | Status: DC | PRN
  Administered 2021-11-04: 150 ug/kg/min via INTRAVENOUS

## 2021-11-04 MED ORDER — PROPOFOL 10 MG/ML IV BOLUS
INTRAVENOUS | Status: DC | PRN
  Administered 2021-11-04: 50 mg via INTRAVENOUS
  Administered 2021-11-04: 100 mg via INTRAVENOUS

## 2021-11-04 MED ORDER — LIDOCAINE HCL (CARDIAC) PF 100 MG/5ML IV SOSY
PREFILLED_SYRINGE | INTRAVENOUS | Status: DC | PRN
  Administered 2021-11-04: 50 mg via INTRAVENOUS

## 2021-11-04 MED ORDER — LACTATED RINGERS IV SOLN: INTRAVENOUS | Status: DC

## 2021-11-04 NOTE — Anesthesia Procedure Notes (Signed)
Date/Time: 11/04/2021 7:35 AM ?Performed by: Julian Reil, CRNA ?Pre-anesthesia Checklist: Patient identified, Emergency Drugs available, Suction available and Patient being monitored ?Patient Re-evaluated:Patient Re-evaluated prior to induction ?Oxygen Delivery Method: Nasal cannula ?Induction Type: IV induction ?Placement Confirmation: positive ETCO2 ? ? ? ? ?

## 2021-11-04 NOTE — Transfer of Care (Signed)
Immediate Anesthesia Transfer of Care Note ? ?Patient: [name] ? ?Procedure(s) Performed: COLONOSCOPY WITH PROPOFOL ? ?Patient Location: Endoscopy Unit ? ?Anesthesia Type:General ? ?Level of Consciousness: drowsy ? ?Airway & Oxygen Therapy: Patient Spontanous Breathing ? ?Post-op Assessment: Report given to RN and Post -op Vital signs reviewed and stable ? ?Post vital signs: Reviewed and stable ? ?Last Vitals:  ?Vitals Value Taken Time  ?BP    ?Temp    ?Pulse    ?Resp    ?SpO2    ? ? ?Last Pain:  ?Vitals:  ? 11/04/21 0728  ?TempSrc:   ?PainSc: 0-No pain  ?   ? ?Patients Stated Pain Goal: 8 (11/04/21 0703) ? ?Complications: No notable events documented. ?

## 2021-11-04 NOTE — Discharge Instructions (Addendum)
?  Colonoscopy ?Discharge Instructions ? ?Read the instructions outlined below and refer to this sheet in the next few weeks. These discharge instructions provide you with general information on caring for yourself after you leave the hospital. Your doctor may also give you specific instructions. While your treatment has been planned according to the most current medical practices available, unavoidable complications occasionally occur. If you have any problems or questions after discharge, call Dr. Gala Romney at [phone]. ?ACTIVITY ?You may resume your regular activity, but move at a slower pace for the next 24 hours.  ?Take frequent rest periods for the next 24 hours.  ?Walking will help get rid of the air and reduce the bloated feeling in your belly (abdomen).  ?No driving for 24 hours (because of the medicine (anesthesia) used during the test).   ?Do not sign any important legal documents or operate any machinery for 24 hours (because of the anesthesia used during the test).  ?NUTRITION ?Drink plenty of fluids.  ?You may resume your normal diet as instructed by your doctor.  ?Begin with a light meal and progress to your normal diet. Heavy or fried foods are harder to digest and may make you feel sick to your stomach (nauseated).  ?Avoid alcoholic beverages for 24 hours or as instructed.  ?MEDICATIONS ?You may resume your normal medications unless your doctor tells you otherwise.  ?WHAT YOU CAN EXPECT TODAY ?Some feelings of bloating in the abdomen.  ?Passage of more gas than usual.  ?Spotting of blood in your stool or on the toilet paper.  ?IF YOU HAD POLYPS REMOVED DURING THE COLONOSCOPY: ?No aspirin products for 7 days or as instructed.  ?No alcohol for 7 days or as instructed.  ?Eat a soft diet for the next 24 hours.  ?FINDING OUT THE RESULTS OF YOUR TEST ?Not all test results are available during your visit. If your test results are not back during the visit, make an appointment with your caregiver to find out the  results. Do not assume everything is normal if you have not heard from your caregiver or the medical facility. It is important for you to follow up on all of your test results.  ?SEEK IMMEDIATE MEDICAL ATTENTION IF: ?You have more than a spotting of blood in your stool.  ?Your belly is swollen (abdominal distention).  ?You are nauseated or vomiting.  ?You have a temperature over 101.  ?You have abdominal pain or discomfort that is severe or gets worse throughout the day.   ? ?Your colon appeared normal today ? ?It is recommended you return in 5 years for repeat colonoscopy ? ?At patient request, I called Issaiah Seemann at [phone] findings and recommendations ? ? ?

## 2021-11-04 NOTE — H&P (Signed)
@LOGO @ ? ? ?Primary Care Physician:  , MD ?Primary Gastroenterologist:  Dr. Benita Stabile ? ?Pre-Procedure History & Physical: ?HPI:  [name] is a 56 y.o. male is here for a screening colonoscopy.  Family history of colon cancer in patient's father.  No GI symptoms. ? ?Negative colonoscopy 2017. ?Past Medical History:  ?Diagnosis Date  ? Anxiety   ? "mostly to unwind and go to sleep, works third shift"  ? Bronchitis   ? Hyperlipidemia   ? controlled with diet  ? Pneumonia   ? hx of  ? Tremor   ? ? ?Past Surgical History:  ?Procedure Laterality Date  ? ANTERIOR CERVICAL DECOMP/DISCECTOMY FUSION  05/21/2012  ? Procedure: ANTERIOR CERVICAL DECOMPRESSION/DISCECTOMY FUSION 2 LEVELS;  Surgeon: 05/23/2012, MD;  Location: [facility];  Service: Neurosurgery;  Laterality: Bilateral;  Cervical five-six, six-seven Anterior cervical decompression/diskectomy/fusion  ? APPENDECTOMY    ? COLONOSCOPY WITH PROPOFOL N/A 06/05/2016  ? Procedure: COLONOSCOPY WITH PROPOFOL;  Surgeon: 06/07/2016, MD;  Location: [facility];  Service: Endoscopy;  Laterality: N/A;  730 ?  ? HERNIA REPAIR    ? umbilical  ? ? ?Prior to Admission medications   ?Medication Sig Start Date End Date Taking? Authorizing Provider  ?carbidopa-levodopa (SINEMET IR) 25-100 MG tablet Take by mouth 3 (three) times daily. Takes 2 tablets each x 3 times daily. 04/27/19  Yes [provider]  ?clonazePAM (KLONOPIN) 1 MG tablet Take 1 mg by mouth 3 (three) times daily.   Yes [provider]  ?desvenlafaxine (PRISTIQ) 25 MG 24 hr tablet Take 1 tablet by mouth daily. 03/25/20  Yes [provider]  ?docusate sodium (COLACE) 100 MG capsule Take 100 mg by mouth at bedtime.   Yes [provider]  ?ibuprofen (ADVIL,MOTRIN) 200 MG tablet Take 600 mg by mouth as needed (for pain).   Yes [provider]  ?Melatonin 10 MG TABS Take by mouth at bedtime.   Yes [provider]  ?meloxicam (MOBIC) 15 MG tablet Take  15 mg by mouth daily. 07/10/21  Yes [provider]  ?Multiple Vitamin (MULTIVITAMIN) capsule Take by mouth daily at 6 (six) AM.   Yes [provider]  ?omeprazole (PRILOSEC) 20 MG capsule Take 20 mg by mouth daily. 08/19/19  Yes [provider]  ?ondansetron (ZOFRAN) 4 MG tablet 4 mg as needed. 08/04/19  Yes [provider]  ?pramipexole (MIRAPEX) 0.25 MG tablet TAKE ONE TABLET (0.25MG  TOTAL) BY MOUTH THREE TIMES DAILY 04/21/19  Yes [provider]  ?vitamin B-12 (CYANOCOBALAMIN) 500 MCG tablet Take by mouth as needed.   Yes [provider]  ? ? ?Allergies as of 10/08/2021  ? (No Known Allergies)  ? ? ?Family History  ?Problem Relation Age of Onset  ? Colon cancer Father 14  ?     Surgical treatment, remission, recurrence with METS to Liver and has passed away age 77  ? ? ?Social History  ? ?Socioeconomic History  ? Marital status: Married  ?  Spouse name: Not on file  ? Number of children: 2  ? Years of education: Not on file  ? Highest education level: Not on file  ?Occupational History  ? Not on file  ?Tobacco Use  ? Smoking status: Never  ? Smokeless tobacco: Never  ?Vaping Use  ? Vaping Use: Never used  ?Substance and Sexual Activity  ? Alcohol use: Yes  ?  Comment: Beer 1-2 times a week  ? Drug  use: No  ? Sexual activity: Not on file  ?Other Topics Concern  ? Not on file  ?Social History Narrative  ? Lives at home with his wife & son  ? Right handed  ? Drinks 4 cups of coffee daily  ? ?Social Determinants of Health  ? ?Financial Resource Strain: Not on file  ?Food Insecurity: Not on file  ?Transportation Needs: Not on file  ?Physical Activity: Not on file  ?Stress: Not on file  ?Social Connections: Not on file  ?Intimate Partner Violence: Not on file  ? ? ?Review of Systems: ?See HPI, otherwise negative ROS ? ?Physical Exam: ?BP 139/78   Temp 97.8 ?F (36.6 ?C) (Oral)   Resp (!) 63   Ht 5\' 8"  (1.727 m)   Wt 102.1 kg   SpO2 97%   BMI 34.21 kg/m?  ?General:    Alert,  Well-developed, well-nourished, pleasant and cooperative in NAD ?Lungs:  Clear throughout to auscultation.   No wheezes, crackles, or rhonchi. No acute distress. ?Heart:  Regular rate and rhythm; no murmurs, clicks, rubs,  or gallops. ?Abdomen:  Soft, nontender and nondistended. No masses, hepatosplenomegaly or hernias noted. Normal bowel sounds, without guarding, and without rebound.   ?Impression/Plan: ?[name] is now here to undergo a screening colonoscopy.  High risk screening examination.  I have offered the patient a high risk screening colonoscopy today ? ?Risks, benefits, limitations, imponderables and alternatives regarding colonoscopy have been reviewed with the patient. Questions have been answered. All parties agreeable.  ? ? ? ?Notice:  This dictation was prepared with Dragon dictation along with smaller phrase technology. Any transcriptional errors that result from this process are unintentional and may not be corrected upon review.  ? ?

## 2021-11-04 NOTE — Anesthesia Preprocedure Evaluation (Signed)
Anesthesia Evaluation  Patient identified by MRN, date of birth, ID band Patient awake    Reviewed: Allergy & Precautions, H&P , NPO status , Patient's Chart, lab work & pertinent test results, reviewed documented beta blocker date and time   Airway Mallampati: II  TM Distance: >3 FB Neck ROM: full    Dental no notable dental hx.    Pulmonary neg pulmonary ROS,    Pulmonary exam normal breath sounds clear to auscultation       Cardiovascular Exercise Tolerance: Good negative cardio ROS   Rhythm:regular Rate:Normal     Neuro/Psych PSYCHIATRIC DISORDERS Anxiety negative neurological ROS     GI/Hepatic negative GI ROS, Neg liver ROS,   Endo/Other  negative endocrine ROS  Renal/GU negative Renal ROS  negative genitourinary   Musculoskeletal   Abdominal   Peds  Hematology negative hematology ROS (+)   Anesthesia Other Findings   Reproductive/Obstetrics negative OB ROS                             Anesthesia Physical Anesthesia Plan  ASA: 2  Anesthesia Plan: General   Post-op Pain Management:    Induction:   PONV Risk Score and Plan: Propofol infusion  Airway Management Planned:   Additional Equipment:   Intra-op Plan:   Post-operative Plan:   Informed Consent: I have reviewed the patients History and Physical, chart, labs and discussed the procedure including the risks, benefits and alternatives for the proposed anesthesia with the patient or authorized representative who has indicated his/her understanding and acceptance.     Dental Advisory Given  Plan Discussed with: CRNA  Anesthesia Plan Comments:         Anesthesia Quick Evaluation  

## 2021-11-04 NOTE — Op Note (Signed)
[facility] ?Patient Name: [name] ?Procedure Date: 11/04/2021 7:05 AM ?MRN: [id number] ?Date of Birth: 19-May-1966 ?Attending MD: Gennette Pac , MD ?CSN: [id number] ?Age: 56 ?Admit Type: Outpatient ?Procedure:                Colonoscopy ?Indications:              Screening in patient at increased risk: Family  ?                          history of 1st-degree relative with colorectal  ?                          cancer ?Providers:                Gennette Pac, MD, Angelica Ran, Judeth Cornfield.  ?                          Jessee Avers, Technician ?Referring MD:              ?Medicines:                Propofol per Anesthesia ?Complications:            No immediate complications. ?Estimated Blood Loss:     Estimated blood loss: none. ?Procedure:                Pre-Anesthesia Assessment: ?                          - Prior to the procedure, a History and Physical  ?                          was performed, and patient medications and  ?                          allergies were reviewed. The patient's tolerance of  ?                          previous anesthesia was also reviewed. The risks  ?                          and benefits of the procedure and the sedation  ?                          options and risks were discussed with the patient.  ?                          All questions were answered, and informed consent  ?                          was obtained. Prior Anticoagulants: The patient has  ?                          taken no previous anticoagulant or antiplatelet  ?                          agents. ASA Grade  Assessment: II - A patient with  ?                          mild systemic disease. After reviewing the risks  ?                          and benefits, the patient was deemed in  ?                          satisfactory condition to undergo the procedure. ?                          After obtaining informed consent, the colonoscope  ?                          was passed under direct vision. Throughout the   ?                          procedure, the patient's blood pressure, pulse, and  ?                          oxygen saturations were monitored continuously. The  ?                          [id number]) scope was introduced through  ?                          the anus and advanced to the the cecum, identified  ?                          by appendiceal orifice and ileocecal valve. The  ?                          colonoscopy was performed without difficulty. The  ?                          patient tolerated the procedure well. The quality  ?                          of the bowel preparation was adequate. ?Scope In: 7:33:07 AM ?Scope Out: 7:45:40 AM ?Scope Withdrawal Time: 0 hours 7 minutes 31 seconds  ?Total Procedure Duration: 0 hours 12 minutes 33 seconds  ?Findings: ?     The perianal and digital rectal examinations were normal. ?     The colon (entire examined portion) appeared normal. ?     The retroflexed view of the distal rectum and anal verge was normal and  ?     showed no anal or rectal abnormalities. ?Impression:               - The entire examined colon is normal. ?                          - The distal rectum and anal verge are normal on  ?  retroflexion view. ?                          - No specimens collected. ?Moderate Sedation: ?     Moderate (conscious) sedation was personally administered by an  ?     anesthesia professional. The following parameters were monitored: oxygen  ?     saturation, heart rate, blood pressure, respiratory rate, EKG, adequacy  ?     of pulmonary ventilation, and response to care. ?Recommendation:           - Patient has a contact number available for  ?                          emergencies. The signs and symptoms of potential  ?                          delayed complications were discussed with the  ?                          patient. Return to normal activities tomorrow.  ?                          Written discharge instructions were provided  to the  ?                          patient. ?                          - Advance diet as tolerated. ?                          - Repeat colonoscopy in 5 years for screening  ?                          purposes. ?                          - Return to GI office (date not yet determined). ?Procedure Code(s):        --- Professional --- ?                          [phone], Colonoscopy, flexible; diagnostic, including  ?                          collection of specimen(s) by brushing or washing,  ?                          when performed (separate procedure) ?Diagnosis Code(s):        --- Professional --- ?                          Z80.0, Family history of malignant neoplasm of  ?                          digestive organs ?CPT copyright 2019 [employer]. All rights reserved. ?The codes documented in this report are preliminary and upon  coder review may  ?be revised to meet current compliance requirements. ?Gerrit Friendsobert M. Azalea Cedar, MD ?Gennette Pacobert Michael Nassim Cosma, MD ?11/04/2021 7:54:10 AM ?This report has been signed electronically. ?Number of Addenda: 0 ?

## 2021-11-04 NOTE — Anesthesia Postprocedure Evaluation (Signed)
Anesthesia Post Note ? ?Patient: [name] ? ?Procedure(s) Performed: COLONOSCOPY WITH PROPOFOL ? ?Patient location during evaluation: Phase II ?Anesthesia Type: General ?Level of consciousness: awake ?Pain management: pain level controlled ?Vital Signs Assessment: post-procedure vital signs reviewed and stable ?Respiratory status: spontaneous breathing and respiratory function stable ?Cardiovascular status: blood pressure returned to baseline and stable ?Postop Assessment: no headache and no apparent nausea or vomiting ?Anesthetic complications: no ?Comments: Late entry ? ? ?No notable events documented. ? ? ?Last Vitals:  ?Vitals:  ? 11/04/21 0749 11/04/21 0754  ?BP: (!) 101/54 113/68  ?Pulse: 71 70  ?Resp: 15 15  ?Temp: 36.7 ?C 36.7 ?C  ?SpO2: 95% 95%  ?  ?Last Pain:  ?Vitals:  ? 11/04/21 0754  ?TempSrc: Oral  ?PainSc: 0-No pain  ? ? ?  ?  ?  ?  ?  ?  ? ?Windell Norfolk ? ? ? ? ?

## 2021-11-11 ENCOUNTER — Encounter (HOSPITAL_COMMUNITY): Payer: Self-pay | Admitting: Internal Medicine

## 2021-11-25 DIAGNOSIS — R4689 Other symptoms and signs involving appearance and behavior: Secondary | ICD-10-CM | POA: Diagnosis not present

## 2021-11-25 DIAGNOSIS — G20 Parkinson's disease: Secondary | ICD-10-CM | POA: Diagnosis not present

## 2021-11-25 DIAGNOSIS — R443 Hallucinations, unspecified: Secondary | ICD-10-CM | POA: Diagnosis not present

## 2021-11-25 DIAGNOSIS — G479 Sleep disorder, unspecified: Secondary | ICD-10-CM | POA: Diagnosis not present

## 2021-11-28 DIAGNOSIS — E782 Mixed hyperlipidemia: Secondary | ICD-10-CM | POA: Diagnosis not present

## 2021-12-04 DIAGNOSIS — K219 Gastro-esophageal reflux disease without esophagitis: Secondary | ICD-10-CM | POA: Diagnosis not present

## 2021-12-04 DIAGNOSIS — E782 Mixed hyperlipidemia: Secondary | ICD-10-CM | POA: Diagnosis not present

## 2021-12-04 DIAGNOSIS — G20 Parkinson's disease: Secondary | ICD-10-CM | POA: Diagnosis not present

## 2021-12-04 DIAGNOSIS — F339 Major depressive disorder, recurrent, unspecified: Secondary | ICD-10-CM | POA: Diagnosis not present

## 2022-03-07 DIAGNOSIS — G20 Parkinson's disease: Secondary | ICD-10-CM | POA: Diagnosis not present

## 2022-03-07 DIAGNOSIS — Z79899 Other long term (current) drug therapy: Secondary | ICD-10-CM | POA: Diagnosis not present

## 2022-03-07 DIAGNOSIS — F419 Anxiety disorder, unspecified: Secondary | ICD-10-CM | POA: Diagnosis not present

## 2022-03-07 DIAGNOSIS — F32A Depression, unspecified: Secondary | ICD-10-CM | POA: Diagnosis not present

## 2022-04-29 DIAGNOSIS — N390 Urinary tract infection, site not specified: Secondary | ICD-10-CM | POA: Diagnosis not present

## 2022-05-09 ENCOUNTER — Telehealth (HOSPITAL_COMMUNITY): Payer: Self-pay

## 2022-05-09 NOTE — Telephone Encounter (Signed)
Called pt to schedule NP appt with Dr Nehemiah Settle no answer left vm

## 2022-06-03 DIAGNOSIS — E782 Mixed hyperlipidemia: Secondary | ICD-10-CM | POA: Diagnosis not present

## 2022-06-10 DIAGNOSIS — K219 Gastro-esophageal reflux disease without esophagitis: Secondary | ICD-10-CM | POA: Diagnosis not present

## 2022-06-10 DIAGNOSIS — Z5320 Procedure and treatment not carried out because of patient's decision for unspecified reasons: Secondary | ICD-10-CM | POA: Diagnosis not present

## 2022-06-10 DIAGNOSIS — E782 Mixed hyperlipidemia: Secondary | ICD-10-CM | POA: Diagnosis not present

## 2022-06-10 DIAGNOSIS — F339 Major depressive disorder, recurrent, unspecified: Secondary | ICD-10-CM | POA: Diagnosis not present

## 2022-06-10 DIAGNOSIS — E669 Obesity, unspecified: Secondary | ICD-10-CM | POA: Diagnosis not present

## 2022-06-10 DIAGNOSIS — Z6832 Body mass index (BMI) 32.0-32.9, adult: Secondary | ICD-10-CM | POA: Diagnosis not present

## 2022-06-10 DIAGNOSIS — G20A2 Parkinson's disease without dyskinesia, with fluctuations: Secondary | ICD-10-CM | POA: Diagnosis not present

## 2022-08-03 ENCOUNTER — Emergency Department (HOSPITAL_COMMUNITY)
Admission: EM | Admit: 2022-08-03 | Discharge: 2022-08-03 | Disposition: A | Discharge status: Home / Self Care | Payer: Medicare Other | Attending: Emergency Medicine | Admitting: Emergency Medicine

## 2022-08-03 ENCOUNTER — Encounter (HOSPITAL_COMMUNITY): Payer: Self-pay | Admitting: *Deleted

## 2022-08-03 ENCOUNTER — Other Ambulatory Visit: Payer: Self-pay

## 2022-08-03 DIAGNOSIS — R1909 Other intra-abdominal and pelvic swelling, mass and lump: Secondary | ICD-10-CM | POA: Diagnosis present

## 2022-08-03 DIAGNOSIS — K439 Ventral hernia without obstruction or gangrene: Secondary | ICD-10-CM

## 2022-08-03 DIAGNOSIS — K469 Unspecified abdominal hernia without obstruction or gangrene: Secondary | ICD-10-CM | POA: Diagnosis not present

## 2022-08-03 HISTORY — DX: Parkinson's disease without dyskinesia, without mention of fluctuations: G20.A1

## 2022-08-03 NOTE — ED Triage Notes (Signed)
Pt c/o feeling a "growth" on the left side of his abdomen that he noticed getting larger this morning. Pt reports he has noticed it there for about the last year or two but he has recently lost a lot of weight and been working out. Now he is feeling like the left side of his abdomen is larger than his right and reports it feels tight.

## 2022-08-03 NOTE — Discharge Instructions (Signed)
What you have is a very soft early hernia on the left side of your abdomen.  This is nothing that you need to be concerned about unless it is causing increasing pain or vomiting.  You may follow-up with a general surgeon such as Dr. Arnoldo Morale here locally if you choose, I do want you to come back to the ER if this is causing severe pain or vomiting.

## 2022-08-03 NOTE — ED Provider Notes (Signed)
[facility] Provider Note   CSN: [id number] Arrival date & time: 08/03/22  1024     History  No chief complaint on file.   [name] is a 57 y.o. male.  HPI   This patient is a 57 year old male, he does have a history of Parkinson's disease, he used to be very very obese but has lost a significant amount of weight especially over the last 4 months and in so doing is noticed that he has had increasing apparent see of a small area of the lump on the left side of his abdomen which is nontender and very soft.  He and his significant other state they have actually seen it for the last year but it is more noticeable over the last several months.  They were encouraged by family member to come to the ER to have a check to make sure it was nothing pathological.  He has no other symptoms.    Home Medications Prior to Admission medications   Medication Sig Start Date End Date Taking? Authorizing Provider  carbidopa-levodopa (SINEMET IR) 25-100 MG tablet Take by mouth 3 (three) times daily. Takes 2 tablets each x 3 times daily. 04/27/19   [provider]  clonazePAM (KLONOPIN) 1 MG tablet Take 1 mg by mouth 3 (three) times daily.    [provider]  desvenlafaxine (PRISTIQ) 25 MG 24 hr tablet Take 1 tablet by mouth daily. 03/25/20   [provider]  docusate sodium (COLACE) 100 MG capsule Take 100 mg by mouth at bedtime.    [provider]  ibuprofen (ADVIL,MOTRIN) 200 MG tablet Take 600 mg by mouth as needed (for pain).    [provider]  Melatonin 10 MG TABS Take by mouth at bedtime.    [provider]  meloxicam (MOBIC) 15 MG tablet Take 15 mg by mouth daily. 07/10/21   [provider]  Multiple Vitamin (MULTIVITAMIN) capsule Take by mouth daily at 6 (six) AM.    [provider]  omeprazole (PRILOSEC) 20 MG capsule Take 20 mg by mouth daily. 08/19/19   [provider]   ondansetron (ZOFRAN) 4 MG tablet 4 mg as needed. 08/04/19   [provider]  pramipexole (MIRAPEX) 0.25 MG tablet TAKE ONE TABLET (0.25MG  TOTAL) BY MOUTH THREE TIMES DAILY 04/21/19   [provider]  vitamin B-12 (CYANOCOBALAMIN) 500 MCG tablet Take by mouth as needed.    [provider]      Allergies    Patient has no known allergies.    Review of Systems   Review of Systems  All other systems reviewed and are negative.   Physical Exam Updated Vital Signs BP 139/70   Pulse 63   Temp 97.9 F (36.6 C) (Oral)   Resp 16   Ht 1.727 m (5\' 8" )   Wt 87.1 kg   SpO2 98%   BMI 29.19 kg/m  Physical Exam Vitals and nursing note reviewed.  Constitutional:      General: He is not in acute distress.    Appearance: He is well-developed.  HENT:     Head: Normocephalic and atraumatic.     Mouth/Throat:     Pharynx: No oropharyngeal exudate.  Eyes:     General: No scleral icterus.       Right eye: No discharge.        Left eye: No discharge.     Conjunctiva/sclera: Conjunctivae normal.  Pupils: Pupils are equal, round, and reactive to light.  Neck:     Thyroid: No thyromegaly.     Vascular: No JVD.  Pulmonary:     Effort: Pulmonary effort is normal. No respiratory distress.  Abdominal:     General: Bowel sounds are normal. There is no distension.     Palpations: Abdomen is soft. There is no mass.     Tenderness: There is no abdominal tenderness.     Comments: Abdomen is extremely soft and nontender, he has mild diastases recti, on the left side of the abdomen at the anterolateral aspect of the abdomen there is another area of a soft bulge, it is minimal, it is not actually visible but it is somewhat palpable.  There is no tenderness, no masses, no nodularity.  Musculoskeletal:        General: No tenderness. Normal range of motion.     Cervical back: Normal range of motion and neck supple.  Lymphadenopathy:     Cervical: No cervical adenopathy.   Skin:    General: Skin is warm and dry.     Findings: Rash present. No erythema.     Comments: Small area of a contact dermatitis at the area where his belt would touch his abdomen, about 2 cm in diameter, he is using a steroid cream  Neurological:     Mental Status: He is alert.     Coordination: Coordination normal.     Comments: Mild tremor  Psychiatric:        Behavior: Behavior normal.     ED Results / Procedures / Treatments   Labs (all labs ordered are listed, but only abnormal results are displayed) Labs Reviewed - No data to display  EKG None  Radiology No results found.  Procedures Procedures    Medications Ordered in ED Medications - No data to display  ED Course/ Medical Decision Making/ A&P                             Medical Decision Making  Well-appearing, clinical mild hernia, no need for surgical intervention, follow-up given, patient agreeable  The patient and significant other were thinking they would need a CT scan but I gave them reassurance that this was nothing surgical and did not appear to be pathological and they can follow-up in the outpatient setting.  They were agreeable to the plan        Final Clinical Impression(s) / ED Diagnoses Final diagnoses:  Abdominal wall hernia    Rx / DC Orders ED Discharge Orders     None         Noemi Chapel, MD 08/03/22 1109

## 2022-08-03 NOTE — ED Notes (Signed)
Pt and spouse requesting CT scan to ensure nothing dangerous is going on. MD notified. Per Dr. Sabra Heck, no indication for CT scan or other imaging at this time. Pt and spouse verbalize understanding.

## 2022-10-08 DIAGNOSIS — G20A1 Parkinson's disease without dyskinesia, without mention of fluctuations: Secondary | ICD-10-CM | POA: Diagnosis not present

## 2022-12-15 DIAGNOSIS — E782 Mixed hyperlipidemia: Secondary | ICD-10-CM | POA: Diagnosis not present

## 2022-12-19 DIAGNOSIS — Z5320 Procedure and treatment not carried out because of patient's decision for unspecified reasons: Secondary | ICD-10-CM | POA: Diagnosis not present

## 2022-12-19 DIAGNOSIS — E782 Mixed hyperlipidemia: Secondary | ICD-10-CM | POA: Diagnosis not present

## 2022-12-19 DIAGNOSIS — K219 Gastro-esophageal reflux disease without esophagitis: Secondary | ICD-10-CM | POA: Diagnosis not present

## 2022-12-19 DIAGNOSIS — G20A1 Parkinson's disease without dyskinesia, without mention of fluctuations: Secondary | ICD-10-CM | POA: Diagnosis not present

## 2022-12-19 DIAGNOSIS — Z0000 Encounter for general adult medical examination without abnormal findings: Secondary | ICD-10-CM | POA: Diagnosis not present

## 2022-12-19 DIAGNOSIS — G20A2 Parkinson's disease without dyskinesia, with fluctuations: Secondary | ICD-10-CM | POA: Diagnosis not present

## 2023-02-20 DIAGNOSIS — U071 COVID-19: Secondary | ICD-10-CM | POA: Diagnosis not present

## 2023-04-09 DIAGNOSIS — G4752 REM sleep behavior disorder: Secondary | ICD-10-CM | POA: Diagnosis not present

## 2023-04-09 DIAGNOSIS — G20A1 Parkinson's disease without dyskinesia, without mention of fluctuations: Secondary | ICD-10-CM | POA: Diagnosis not present

## 2023-06-15 DIAGNOSIS — R7301 Impaired fasting glucose: Secondary | ICD-10-CM | POA: Diagnosis not present

## 2023-06-15 DIAGNOSIS — E782 Mixed hyperlipidemia: Secondary | ICD-10-CM | POA: Diagnosis not present

## 2023-06-19 DIAGNOSIS — M25542 Pain in joints of left hand: Secondary | ICD-10-CM | POA: Diagnosis not present

## 2023-06-19 DIAGNOSIS — E782 Mixed hyperlipidemia: Secondary | ICD-10-CM | POA: Diagnosis not present

## 2023-06-19 DIAGNOSIS — M2550 Pain in unspecified joint: Secondary | ICD-10-CM | POA: Diagnosis not present

## 2023-06-19 DIAGNOSIS — M25541 Pain in joints of right hand: Secondary | ICD-10-CM | POA: Diagnosis not present

## 2023-06-19 DIAGNOSIS — G20A1 Parkinson's disease without dyskinesia, without mention of fluctuations: Secondary | ICD-10-CM | POA: Diagnosis not present

## 2023-06-19 DIAGNOSIS — Z7182 Exercise counseling: Secondary | ICD-10-CM | POA: Diagnosis not present

## 2023-06-19 DIAGNOSIS — K219 Gastro-esophageal reflux disease without esophagitis: Secondary | ICD-10-CM | POA: Diagnosis not present

## 2023-06-19 DIAGNOSIS — M25572 Pain in left ankle and joints of left foot: Secondary | ICD-10-CM | POA: Diagnosis not present

## 2023-06-19 DIAGNOSIS — G20A2 Parkinson's disease without dyskinesia, with fluctuations: Secondary | ICD-10-CM | POA: Diagnosis not present

## 2023-06-19 DIAGNOSIS — Z713 Dietary counseling and surveillance: Secondary | ICD-10-CM | POA: Diagnosis not present

## 2023-09-23 DIAGNOSIS — R059 Cough, unspecified: Secondary | ICD-10-CM | POA: Diagnosis not present

## 2023-09-23 DIAGNOSIS — J019 Acute sinusitis, unspecified: Secondary | ICD-10-CM | POA: Diagnosis not present

## 2023-12-07 DIAGNOSIS — G20A1 Parkinson's disease without dyskinesia, without mention of fluctuations: Secondary | ICD-10-CM | POA: Diagnosis not present

## 2023-12-14 DIAGNOSIS — Z79899 Other long term (current) drug therapy: Secondary | ICD-10-CM | POA: Diagnosis not present

## 2023-12-14 DIAGNOSIS — K6289 Other specified diseases of anus and rectum: Secondary | ICD-10-CM | POA: Diagnosis not present

## 2023-12-14 DIAGNOSIS — E782 Mixed hyperlipidemia: Secondary | ICD-10-CM | POA: Diagnosis not present

## 2023-12-14 DIAGNOSIS — N390 Urinary tract infection, site not specified: Secondary | ICD-10-CM | POA: Diagnosis not present

## 2023-12-18 DIAGNOSIS — G20A2 Parkinson's disease without dyskinesia, with fluctuations: Secondary | ICD-10-CM | POA: Diagnosis not present

## 2023-12-18 DIAGNOSIS — K219 Gastro-esophageal reflux disease without esophagitis: Secondary | ICD-10-CM | POA: Diagnosis not present

## 2023-12-18 DIAGNOSIS — M2550 Pain in unspecified joint: Secondary | ICD-10-CM | POA: Diagnosis not present

## 2023-12-18 DIAGNOSIS — E782 Mixed hyperlipidemia: Secondary | ICD-10-CM | POA: Diagnosis not present
